# Patient Record
Sex: Female | Born: 1935 | Race: White | Hispanic: No | Smoking: Never smoker
Health system: Southern US, Community
[De-identification: ages and names within clinical notes are randomized; demographics above are authoritative.]

## PROBLEM LIST (undated history)

## (undated) DIAGNOSIS — S329XXA Fracture of unspecified parts of lumbosacral spine and pelvis, initial encounter for closed fracture: Secondary | ICD-10-CM

## (undated) HISTORY — PX: CATARACT EXTRACTION: SUR2

---

## 1998-05-05 ENCOUNTER — Ambulatory Visit (HOSPITAL_COMMUNITY): Admission: RE | Admit: 1998-05-05 | Discharge: 1998-05-05 | Payer: Self-pay | Admitting: Obstetrics and Gynecology

## 1999-05-07 ENCOUNTER — Ambulatory Visit (HOSPITAL_COMMUNITY): Admission: RE | Admit: 1999-05-07 | Discharge: 1999-05-07 | Payer: Self-pay | Admitting: Obstetrics and Gynecology

## 1999-05-07 ENCOUNTER — Encounter: Payer: Self-pay | Admitting: Obstetrics and Gynecology

## 1999-06-08 ENCOUNTER — Other Ambulatory Visit: Admission: RE | Admit: 1999-06-08 | Discharge: 1999-06-08 | Payer: Self-pay | Admitting: Obstetrics and Gynecology

## 2000-06-28 ENCOUNTER — Other Ambulatory Visit: Admission: RE | Admit: 2000-06-28 | Discharge: 2000-06-28 | Payer: Self-pay | Admitting: Family Medicine

## 2000-07-11 ENCOUNTER — Ambulatory Visit (HOSPITAL_COMMUNITY): Admission: RE | Admit: 2000-07-11 | Discharge: 2000-07-11 | Payer: Self-pay | Admitting: Family Medicine

## 2000-07-11 ENCOUNTER — Encounter: Payer: Self-pay | Admitting: Family Medicine

## 2013-11-12 ENCOUNTER — Encounter (HOSPITAL_COMMUNITY): Payer: Self-pay | Admitting: Emergency Medicine

## 2013-11-12 DIAGNOSIS — M545 Low back pain, unspecified: Secondary | ICD-10-CM | POA: Insufficient documentation

## 2013-11-12 DIAGNOSIS — I10 Essential (primary) hypertension: Secondary | ICD-10-CM | POA: Insufficient documentation

## 2013-11-12 NOTE — ED Notes (Signed)
Pt st's she started having discomfort in right hip and leg  1 week ago.  St's she could walk and pain would subside.  Pt st's tonight unable to walk due to pain.  St's she walked a mile earlier today without any problem.  Pt denies any injury or fall

## 2013-11-13 ENCOUNTER — Emergency Department (HOSPITAL_COMMUNITY)
Admission: EM | Admit: 2013-11-13 | Discharge: 2013-11-13 | Disposition: A | Discharge status: Home / Self Care | Payer: Medicare Other | Attending: Emergency Medicine | Admitting: Emergency Medicine

## 2013-11-13 ENCOUNTER — Emergency Department (HOSPITAL_COMMUNITY): Payer: Medicare Other

## 2013-11-13 DIAGNOSIS — I10 Essential (primary) hypertension: Secondary | ICD-10-CM

## 2013-11-13 DIAGNOSIS — M549 Dorsalgia, unspecified: Secondary | ICD-10-CM

## 2013-11-13 LAB — URINALYSIS, ROUTINE W REFLEX MICROSCOPIC
Bilirubin Urine: NEGATIVE
Glucose, UA: NEGATIVE mg/dL
Ketones, ur: NEGATIVE mg/dL
Leukocytes, UA: NEGATIVE
Nitrite: NEGATIVE
Protein, ur: NEGATIVE mg/dL
Specific Gravity, Urine: 1.01 (ref 1.005–1.030)
Urobilinogen, UA: 0.2 mg/dL (ref 0.0–1.0)
pH: 7 (ref 5.0–8.0)

## 2013-11-13 LAB — URINE MICROSCOPIC-ADD ON

## 2013-11-13 MED ORDER — AMLODIPINE BESYLATE 10 MG PO TABS
10.0000 mg | ORAL_TABLET | Freq: Once | ORAL | Status: AC
  Administered 2013-11-13: 10 mg via ORAL
  Filled 2013-11-13: qty 1

## 2013-11-13 MED ORDER — OXYCODONE-ACETAMINOPHEN 5-325 MG PO TABS
1.0000 | ORAL_TABLET | Freq: Once | ORAL | Status: AC
  Administered 2013-11-13: 1 via ORAL
  Filled 2013-11-13: qty 1

## 2013-11-13 MED ORDER — OXYCODONE-ACETAMINOPHEN 5-325 MG PO TABS: 1.0000 | ORAL_TABLET | ORAL | Status: DC | PRN

## 2013-11-13 NOTE — ED Provider Notes (Signed)
CSN: [id number]     Arrival date & time 11/12/13  2239 History   First MD Initiated Contact with Patient 11/13/13 0056     Chief Complaint  Patient presents with  . Hip Pain     (Consider location/radiation/quality/duration/timing/severity/associated sxs/prior Treatment) HPI Comments: 80 year old female with no significant past medical history presents with a complaint of low back pain. She states that the symptoms started 3 or 4 days ago, were gradual in onset, waxes and wanes in intensity and is a crampy dull feeling in her bilateral lower back. She has some radiation into her right leg which she describes as a burning sensation that goes above the knee. She denies any numbness, denies weakness, she was able to ambulate 1 mile today. She has no fever, no chills, no history of IV drug use, no history of cancer, the pain does get better with certain positions and she states that she feels better when she walks. There has been no urinary incontinence, dysuria or any other complaints.  She has had no pain medications prior to arrival.  Patient is a 80 y.o. female presenting with hip pain. The history is provided by the patient and a relative.  Hip Pain    History reviewed. No pertinent past medical history. Past Surgical History  Procedure Laterality Date  . Cataract extraction     No family history on file. History  Substance Use Topics  . Smoking status: Never Smoker   . Smokeless tobacco: Not on file  . Alcohol Use: No   OB History   Grav Para Term Preterm Abortions TAB SAB Ect Mult Living                 Review of Systems  All other systems reviewed and are negative.      Allergies  Review of patient's allergies indicates no known allergies.  Home Medications   Current Outpatient Rx  Name  Route  Sig  Dispense  Refill  . oxyCODONE-acetaminophen (PERCOCET/ROXICET) 5-325 MG per tablet   Oral   Take 1 tablet by mouth every 4 (four) hours as needed for severe pain.  6 tablet   0    BP 189/70  Pulse 56  Temp(Src) 97.8 F (36.6 C) (Oral)  Resp 18  Wt 122 lb 5 oz (55.481 kg)  SpO2 96% Physical Exam  Nursing note and vitals reviewed. Constitutional: She appears well-developed and well-nourished. No distress.  HENT:  Head: Normocephalic and atraumatic.  Mouth/Throat: Oropharynx is clear and moist. No oropharyngeal exudate.  Eyes: Conjunctivae and EOM are normal. Pupils are equal, round, and reactive to light. Right eye exhibits no discharge. Left eye exhibits no discharge. No scleral icterus.  Neck: Normal range of motion. Neck supple. No JVD present. No thyromegaly present.  Cardiovascular: Normal rate, regular rhythm, normal heart sounds and intact distal pulses.  Exam reveals no gallop and no friction rub.   No murmur heard. Pulmonary/Chest: Effort normal and breath sounds normal. No respiratory distress. She has no wheezes. She has no rales.  Abdominal: Soft. Bowel sounds are normal. She exhibits no distension and no mass. There is no tenderness.  No pulsating mass, no tenderness, very soft  Musculoskeletal: Normal range of motion. She exhibits no edema and no tenderness.  No reproducible tenderness over the spine or the paraspinal muscles  Lymphadenopathy:    She has no cervical adenopathy.  Neurological: She is alert. Coordination normal.  Speech is clear, cranial nerves III through XII are intact, memory is  intact, strength is normal in all 4 extremities including grips, sensation is intact to light touch and pinprick in all 4 extremities. Coordination as tested by finger-nose-finger is normal, no limb ataxia. Normal gait, normal reflexes at the patellar tendons bilaterally  Skin: Skin is warm and dry. No rash noted. No erythema.  Psychiatric: She has a normal mood and affect. Her behavior is normal.    ED Course  Procedures (including critical care time) Labs Review Labs Reviewed  URINALYSIS, ROUTINE W REFLEX MICROSCOPIC - Abnormal;  Notable for the following:    Hgb urine dipstick TRACE (*)    All other components within normal limits  URINE MICROSCOPIC-ADD ON   Imaging Review Dg Lumbar Spine Complete  11/13/2013   CLINICAL DATA:  Low back, right hip and leg pain.  EXAM: LUMBAR SPINE - COMPLETE 4+ VIEW  COMPARISON:  None.  FINDINGS: No fracture is identified. The patient has marked convex right scoliosis and severe multilevel degenerative disc disease. Paraspinous structures are unremarkable.  IMPRESSION: No acute finding.  Scoliosis and severe spondylosis.   Electronically Signed   By: Drusilla Kannerhomas  Dalessio M.D.   On: 11/13/2013 01:46     MDM   Final diagnoses:  Back pain  Hypertension    The patient has a normal neurologic exam, normal abdominal exam, normal musculoskeletal exam. She has no urinary symptoms. I suspect a musculoskeletal etiology of her pain however other sources would be entertained including bony etiologies, compression fractures, metastatic disease though less likely. She is very hypertensive, will give pain medication and reevaluate, may need to start an antihypertensive. She denies history of hypertension  Patient informed of hypertension, this has improved, her pain has improved with medication, she has been informed of the need to follow up in 48 hours.  X-ray shows no signs of fractures or tumors   Meds given in ED:  Medications  oxyCODONE-acetaminophen (PERCOCET/ROXICET) 5-325 MG per tablet 1 tablet (1 tablet Oral Given 11/13/13 0125)  amLODipine (NORVASC) tablet 10 mg (10 mg Oral Given 11/13/13 0216)    New Prescriptions   OXYCODONE-ACETAMINOPHEN (PERCOCET/ROXICET) 5-325 MG PER TABLET    Take 1 tablet by mouth every 4 (four) hours as needed for severe pain.       Vida RollerBrian D Lacorey Brusca, MD 11/13/13 [phone]

## 2013-11-13 NOTE — Discharge Instructions (Signed)
Your blood pressure was elevated. You will have to have her blood pressure rechecked at your doctor's office this week at your followup visit. You may need to be started on blood pressure medication.  Back Pain:   Your back pain should be treated with medicines such as ibuprofen or aleve and this back pain should get better over the next 2 weeks.  However if you develop severe or worsening pain, low back pain with fever, numbness, weakness or inability to walk or urinate, you should return to the ER immediately.  Please follow up with your doctor this week for a recheck if still having symptoms. Low back pain is discomfort in the lower back that may be due to injuries to muscles and ligaments around the spine.  Occasionally, it may be caused by a a problem to a part of the spine called a disc.  The pain may last several days or a week;  However, most patients get completely well in 4 weeks.  Self - care:  The application of heat can help soothe the pain.  Maintaining your daily activities, including walking, is encourged, as it will help you get better faster than just staying in bed.  Medications are also useful to help with pain control.  A commonly prescribed medications includes acetaminophen.  This medication is generally safe, though you should not take more than 8 of the extra strength (500mg ) pills a day.  Non steroidal anti inflammatory medications including Ibuprofen and naproxen;  These medications help both pain and swelling and are very useful in treating back pain.  They should be taken with food, as they can cause stomach upset, and more seriously, stomach bleeding.    Muscle relaxants:  These medications can help with muscle tightness that is a cause of lower back pain.  Most of these medications can cause drowsiness, and it is not safe to drive or use dangerous machinery while taking them.  You will need to follow up with  Your primary healthcare provider in 1-2 weeks for  reassessment.  Be aware that if you develop new symptoms, such as a fever, leg weakness, difficulty with or loss of control of your urine or bowels, abdominal pain, or more severe pain, you will need to seek medical attention and  / or return to the Emergency department.  If you do not have a doctor see the list below.  Please call your doctor for a followup appointment within 24-48 hours. When you talk to your doctor please let them know that you were seen in the emergency department and have them acquire all of your records so that they can discuss the findings with you and formulate a treatment plan to fully care for your new and ongoing problems.

## 2013-11-13 NOTE — ED Notes (Signed)
Pt taken to xray 

## 2016-11-27 ENCOUNTER — Ambulatory Visit (INDEPENDENT_AMBULATORY_CARE_PROVIDER_SITE_OTHER): Payer: Medicare Other

## 2016-11-27 ENCOUNTER — Ambulatory Visit (HOSPITAL_COMMUNITY)
Admission: EM | Admit: 2016-11-27 | Discharge: 2016-11-27 | Disposition: A | Discharge status: Home / Self Care | Payer: Medicare Other | Attending: Family Medicine | Admitting: Family Medicine

## 2016-11-27 ENCOUNTER — Encounter (HOSPITAL_COMMUNITY): Payer: Self-pay | Admitting: Emergency Medicine

## 2016-11-27 DIAGNOSIS — S32592A Other specified fracture of left pubis, initial encounter for closed fracture: Secondary | ICD-10-CM

## 2016-11-27 DIAGNOSIS — M25552 Pain in left hip: Secondary | ICD-10-CM | POA: Diagnosis not present

## 2016-11-27 MED ORDER — HYDROCODONE-ACETAMINOPHEN 5-325 MG PO TABS: 1.0000 | ORAL_TABLET | Freq: Four times a day (QID) | ORAL | 0 refills | Status: DC | PRN

## 2016-11-27 NOTE — ED Provider Notes (Signed)
[facility]    CSN: [id number] Arrival date & time: 11/27/16  1203     History   Chief Complaint Chief Complaint  Patient presents with  . Leg Pain    HPI Anna Mullins is a 83 y.o. female.   This 83 year old woman who comes to the urgent care center complaining of left proximal thigh pain for one month. She states that she's had no trauma but was given an anti-inflammatory about 2 weeks ago and has noticed the pain is getting worse. She no longer compare weight and she complains about pain anytime she moves the hip. She is using a bedside commode at home. She hasn't left her house except to go to the doctor in the last couple weeks.      History reviewed. No pertinent past medical history.  There are no active problems to display for this patient.   Past Surgical History:  Procedure Laterality Date  . CATARACT EXTRACTION      OB History    No data available       Home Medications    Prior to Admission medications   Medication Sig Start Date End Date Taking? Authorizing Provider  ibuprofen (ADVIL,MOTRIN) 200 MG tablet Take 200 mg by mouth every 6 (six) hours as needed.   Yes Historical Provider, MD  HYDROcodone-acetaminophen (NORCO) 5-325 MG tablet Take 1 tablet by mouth every 6 (six) hours as needed for moderate pain. 11/27/16   Elvina Sidle, MD    Family History History reviewed. No pertinent family history.  Social History Social History  Substance Use Topics  . Smoking status: Never Smoker  . Smokeless tobacco: Never Used  . Alcohol use No     Allergies   Patient has no known allergies.   Review of Systems Review of Systems  Constitutional: Negative.   Musculoskeletal: Positive for gait problem.     Physical Exam Triage Vital Signs ED Triage Vitals [11/27/16 1259]  Enc Vitals Group     BP (!) 171/70     Pulse Rate 76     Resp 16     Temp 98.1 F (36.7 C)     Temp Source Oral     SpO2 96 %     Weight      Height      Head  Circumference      Peak Flow      Pain Score 10     Pain Loc      Pain Edu?      Excl. in GC?    No data found.   Updated Vital Signs BP (!) 171/70 (BP Location: Left Arm)   Pulse 76   Temp 98.1 F (36.7 C) (Oral)   Resp 16   SpO2 96%    Physical Exam  Constitutional: She is oriented to person, place, and time. She appears well-developed and well-nourished.  HENT:  Right Ear: External ear normal.  Left Ear: External ear normal.  Eyes: Conjunctivae and EOM are normal. Pupils are equal, round, and reactive to light.  Neck: Normal range of motion. Neck supple.  Pulmonary/Chest: Effort normal.  Musculoskeletal:  Pain with hip flexion on the left. Patient can internally and externally rotate her leg without problem. There's been no loss of muscle mass and she has no localized tenderness.  Neurological: She is alert and oriented to person, place, and time.  Skin: Skin is warm and dry.  Nursing note and vitals reviewed.    UC Treatments / Results  Labs (all labs ordered are listed, but only abnormal results are displayed) Labs Reviewed - No data to display  EKG  EKG Interpretation None       Radiology Dg Hip Unilat With Pelvis 2-3 Views Left  Result Date: 11/27/2016 CLINICAL DATA:  LEFT hip pain for 6 weeks, worsening, unable to bear weight for the past 3 days without extreme pain, unable to walk without assistance EXAM: DG HIP (WITH OR WITHOUT PELVIS) 2-3V LEFT COMPARISON:  None FINDINGS: Diffuse osseous demineralization. Minimal narrowing of the hip joints bilaterally. SI joints preserved. Subacute mildly displaced fracture at the LEFT superior pubic ramus identified. Probable nondisplaced fracture inferior LEFT pubic ramus. No femoral fracture or dislocation. IMPRESSION: Osseous demineralization. Subacute mildly displaced fracture at the LEFT superior pubic ramus. Probable nondisplaced fracture of the LEFT inferior pubic ramus. Electronically Signed   By: Ulyses Southward M.D.    On: 11/27/2016 13:41    Procedures Procedures (including critical care time)  Medications Ordered in UC Medications - No data to display   Initial Impression / Assessment and Plan / UC Course  I have reviewed the triage vital signs and the nursing notes.  Pertinent labs & imaging results that were available during my care of the patient were reviewed by me and considered in my medical decision making (see chart for details).     Final Clinical Impressions(s) / UC Diagnoses   Final diagnoses:  Closed fracture of ramus of left pubis, initial encounter (HCC)    New Prescriptions New Prescriptions   HYDROCODONE-ACETAMINOPHEN (NORCO) 5-325 MG TABLET    Take 1 tablet by mouth every 6 (six) hours as needed for moderate pain.     Elvina Sidle, MD 11/27/16 [phone]

## 2016-11-27 NOTE — ED Triage Notes (Signed)
The patient presented to the [facility] with her daughter with a complaint of left leg pain x 2 months. The patient denied any known injury. The patient reported that she was immobile today. The patient reported that she was evaluated by her PCP for a DVT which was negative.

## 2016-11-27 NOTE — Discharge Instructions (Signed)
Continue to take calcium supplements that contain vitamin D.  Try to stay off your feet except when absolutely necessary.  Take prunes or fruit to prevent constipation

## 2016-12-22 ENCOUNTER — Ambulatory Visit
Admission: RE | Admit: 2016-12-22 | Discharge: 2016-12-22 | Disposition: A | Discharge status: Home / Self Care | Payer: Medicare Other | Source: Ambulatory Visit | Attending: Family Medicine | Admitting: Family Medicine

## 2016-12-22 ENCOUNTER — Other Ambulatory Visit: Payer: Self-pay | Admitting: Family Medicine

## 2016-12-22 DIAGNOSIS — S32512D Fracture of superior rim of left pubis, subsequent encounter for fracture with routine healing: Secondary | ICD-10-CM

## 2017-07-10 ENCOUNTER — Ambulatory Visit (INDEPENDENT_AMBULATORY_CARE_PROVIDER_SITE_OTHER): Payer: Medicare Other

## 2017-07-10 ENCOUNTER — Other Ambulatory Visit: Payer: Self-pay

## 2017-07-10 ENCOUNTER — Ambulatory Visit (HOSPITAL_COMMUNITY)
Admission: EM | Admit: 2017-07-10 | Discharge: 2017-07-10 | Disposition: A | Discharge status: Home / Self Care | Payer: Medicare Other | Attending: Emergency Medicine | Admitting: Emergency Medicine

## 2017-07-10 ENCOUNTER — Encounter (HOSPITAL_COMMUNITY): Payer: Self-pay | Admitting: Emergency Medicine

## 2017-07-10 DIAGNOSIS — H1033 Unspecified acute conjunctivitis, bilateral: Secondary | ICD-10-CM | POA: Diagnosis not present

## 2017-07-10 DIAGNOSIS — J069 Acute upper respiratory infection, unspecified: Secondary | ICD-10-CM | POA: Diagnosis not present

## 2017-07-10 DIAGNOSIS — R05 Cough: Secondary | ICD-10-CM | POA: Diagnosis not present

## 2017-07-10 HISTORY — DX: Fracture of unspecified parts of lumbosacral spine and pelvis, initial encounter for closed fracture: S32.9XXA

## 2017-07-10 MED ORDER — CIPROFLOXACIN HCL 0.3 % OP SOLN: OPHTHALMIC | 0 refills | Status: DC

## 2017-07-10 MED ORDER — AEROCHAMBER PLUS MISC: 2 refills | Status: DC

## 2017-07-10 MED ORDER — FLUORESCEIN SODIUM 0.6 MG OP STRP
ORAL_STRIP | OPHTHALMIC | Status: AC
  Filled 2017-07-10: qty 2

## 2017-07-10 MED ORDER — ALBUTEROL SULFATE HFA 108 (90 BASE) MCG/ACT IN AERS: 1.0000 | INHALATION_SPRAY | Freq: Four times a day (QID) | RESPIRATORY_TRACT | 0 refills | Status: DC | PRN

## 2017-07-10 MED ORDER — FLUTICASONE PROPIONATE 50 MCG/ACT NA SUSP: 2.0000 | Freq: Every day | NASAL | 0 refills | Status: DC

## 2017-07-10 MED ORDER — TETRACAINE HCL 0.5 % OP SOLN
OPHTHALMIC | Status: AC
Start: 2017-07-10 — End: 2017-07-10
  Filled 2017-07-10: qty 4

## 2017-07-10 NOTE — ED Provider Notes (Signed)
HPI  SUBJECTIVE:  Anna DonovanRuth Mullins is a 83 y.o. female who presents with itchy, watery, red eyes for the past 4 days.  She reports crusting, drainage from her eyes bilaterally, nasal congestion, rhinorrhea and a cough productive of dark greenish mucus.  Questionable postnasal drip.  No fevers, wheezing, chest pain, shortness of breath.  No sneezing.  No visual changes, headaches, photophobia.  She reports decreased appetite but no vomiting.  She tried a lubricating eyedrop, DayQuil and cough drops without improvement in her symptoms.  No aggravating factors.  No antibiotics in the past month.  She did take an antipyretic in the past 6-8 hours.  She has a past medical history of seasonal allergies.  No history of diabetes, hypertension, asthma, emphysema, COPD, smoking, pneumonia, CHF.  ZOX:WRUEAVPCP:Harris, Chrissie NoaWilliam, MD   Past Medical History:  Diagnosis Date  . Pelvic fracture [facility](HCC)     Past Surgical History:  Procedure Laterality Date  . CATARACT EXTRACTION      No family history on file.  Social History   Tobacco Use  . Smoking status: Never Smoker  . Smokeless tobacco: Never Used  Substance Use Topics  . Alcohol use: No  . Drug use: No    No current facility-administered medications for this encounter.   Current Outpatient Medications:  .  Pseudoephedrine-APAP-DM (DAYQUIL PO), Take by mouth., Disp: , Rfl:  .  albuterol (PROVENTIL HFA;VENTOLIN HFA) 108 (90 Base) MCG/ACT inhaler, Inhale 1-2 puffs every 6 (six) hours as needed into the lungs for wheezing or shortness of breath., Disp: 1 Inhaler, Rfl: 0 .  ciprofloxacin (CILOXAN) 0.3 % ophthalmic solution, 1-2 drops in affected eye 4 times/day x 5 days, Disp: 5 mL, Rfl: 0 .  fluticasone (FLONASE) 50 MCG/ACT nasal spray, Place 2 sprays daily into both nostrils., Disp: 16 g, Rfl: 0 .  ibuprofen (ADVIL,MOTRIN) 200 MG tablet, Take 200 mg by mouth every 6 (six) hours as needed., Disp: , Rfl:  .  Spacer/Aero-Holding Chambers (AEROCHAMBER PLUS) inhaler,  Use as instructed, Disp: 1 each, Rfl: 2  No Known Allergies   ROS  As noted in HPI.   Physical Exam  BP (!) 159/79 (BP Location: Left Arm)   Pulse 87   Temp 99.4 F (37.4 C) (Oral)   Resp 20   SpO2 95%   @LASTSAO2 (3)@  Constitutional: Well developed, well nourished, no acute distress Eyes:  EOMI, bilateral conjunctival injection.  PERRLA.  Positive purulent drainage coming from the left eye.  No direct or consensual photophobia.  No foreign body seen on lid eversion bilaterally.  No abrasion seen on flourescin exam bilaterally.  No periorbital erythema, edema. HENT: Normocephalic, atraumatic,mucus membranes moist Respiratory: Normal inspiratory effort.  No rales, rhonchi.  No wheezing. Cardiovascular: Normal rate regular rhythm no murmurs rubs or gallops GI: nondistended skin: No rash, skin intact Musculoskeletal: no deformities Neurologic: Alert & oriented x 3, no focal neuro deficits Psychiatric: Speech and behavior appropriate   ED Course   Medications - No data to display  Orders Placed This Encounter  Procedures  . DG Chest 2 View    Standing Status:   Standing    Number of Occurrences:   1    Order Specific Question:   Reason for Exam (SYMPTOM  OR DIAGNOSIS REQUIRED)    Answer:   cough r/o PNA    No results found for this or any previous visit (from the past 24 hour(s)). Dg Chest 2 View  Result Date: 07/10/2017 CLINICAL DATA:  Cough and congestion  EXAM: CHEST  2 VIEW COMPARISON:  None. FINDINGS: Hyperinflation. No focal pulmonary infiltrate or effusion. Normal heart size. Aortic atherosclerosis. No pneumothorax. Old right-sided rib fractures. IMPRESSION: Hyperinflation.  No focal pulmonary infiltrate. Electronically Signed   By: Jasmine PangKim  Fujinaga M.D.   On: 07/10/2017 19:34    ED Clinical Impression  Upper respiratory tract infection, unspecified type  Acute conjunctivitis of both eyes, unspecified acute conjunctivitis type   ED  Assessment/Plan   Reviewed imaging independently.  No focal infiltrate or effusion.  See radiology report for full details.  Presentation consistent with URI, possibly an early lower respiratory infection.  She does not have a pneumonia on x-ray.  She has had no fevers.  Afebrile here.  Acceptable vital signs.  Withholding antibiotics today.  Plan to send her home with albuterol inhaler with a spacer, Flonase.  She will continue Mucinex.  Also antibiotic eyedrops for her eyes given the extensive purulent drainage.  She will follow-up with her primary care physician in 3 days to make sure that she is getting better, she will go to the ER for any worsening of her symptoms.  Discussed imaging, MDM, plan and followup with patient. Discussed sn/sx that should prompt return to the ED. patient agrees with plan.   Meds ordered this encounter  Medications  . Pseudoephedrine-APAP-DM (DAYQUIL PO)    Sig: Take by mouth.  . fluticasone (FLONASE) 50 MCG/ACT nasal spray    Sig: Place 2 sprays daily into both nostrils.    Dispense:  16 g    Refill:  0  . albuterol (PROVENTIL HFA;VENTOLIN HFA) 108 (90 Base) MCG/ACT inhaler    Sig: Inhale 1-2 puffs every 6 (six) hours as needed into the lungs for wheezing or shortness of breath.    Dispense:  1 Inhaler    Refill:  0  . Spacer/Aero-Holding Chambers (AEROCHAMBER PLUS) inhaler    Sig: Use as instructed    Dispense:  1 each    Refill:  2  . ciprofloxacin (CILOXAN) 0.3 % ophthalmic solution    Sig: 1-2 drops in affected eye 4 times/day x 5 days    Dispense:  5 mL    Refill:  0    *This clinic note was created using Scientist, clinical (histocompatibility and immunogenetics)Dragon dictation software. Therefore, there may be occasional mistakes despite careful proofreading.   ?   Domenick GongMortenson, Haely Leyland, MD 07/10/17 2130

## 2017-07-10 NOTE — ED Triage Notes (Signed)
Cough, eyes red and watery, has had head congestion, now chest congestion and phlegm is green.  No known fever.  No appetite

## 2017-07-10 NOTE — Discharge Instructions (Signed)
1-2 puffs from your albuterol inhaler every 4-6 hours as needed for coughing.   start the Mucinex multisymptom medication, if there are to many side effects, then try plain Mucinex.  Cool compresses to the eyes, continue using the lubricating eyedrops.  Use the antibiotic eyedrops as prescribed unless directed differently by your primary care provider.  Follow-up with him in several days.  Go immediately to the ER if you get worse or for the signs and symptoms we discussed.

## 2017-07-11 ENCOUNTER — Telehealth (HOSPITAL_COMMUNITY): Payer: Self-pay | Admitting: Emergency Medicine

## 2017-07-11 MED ORDER — POLYMYXIN B-TRIMETHOPRIM 10000-0.1 UNIT/ML-% OP SOLN: 1.0000 [drp] | OPHTHALMIC | 0 refills | Status: AC

## 2017-07-11 NOTE — Telephone Encounter (Signed)
Pt family called and stated that eye drops were on back order and needed new RX; [facility]

## 2017-11-23 IMAGING — DX DG CHEST 2V
2 series · 2 of 2 positions shown · non-contrast
Comparison: None.

CLINICAL DATA: Cough and congestion

EXAM:
CHEST  2 VIEW

[chest pa]
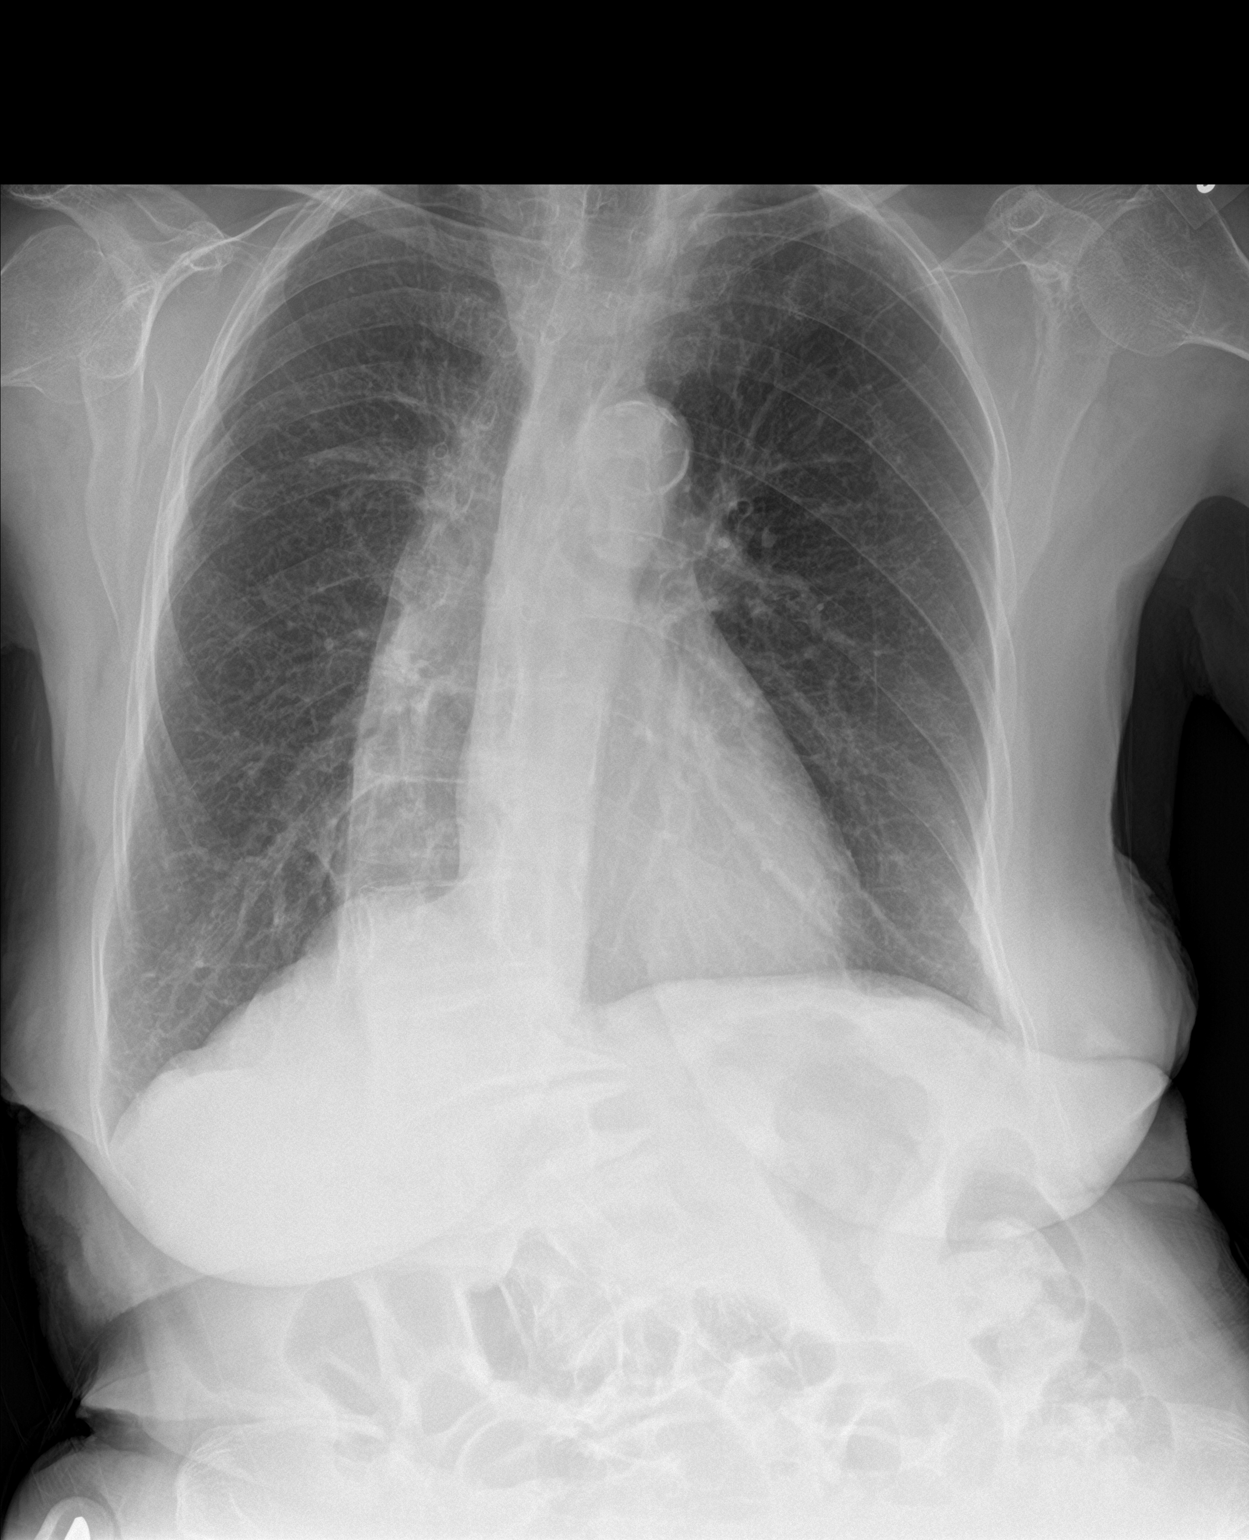

[chest lat]
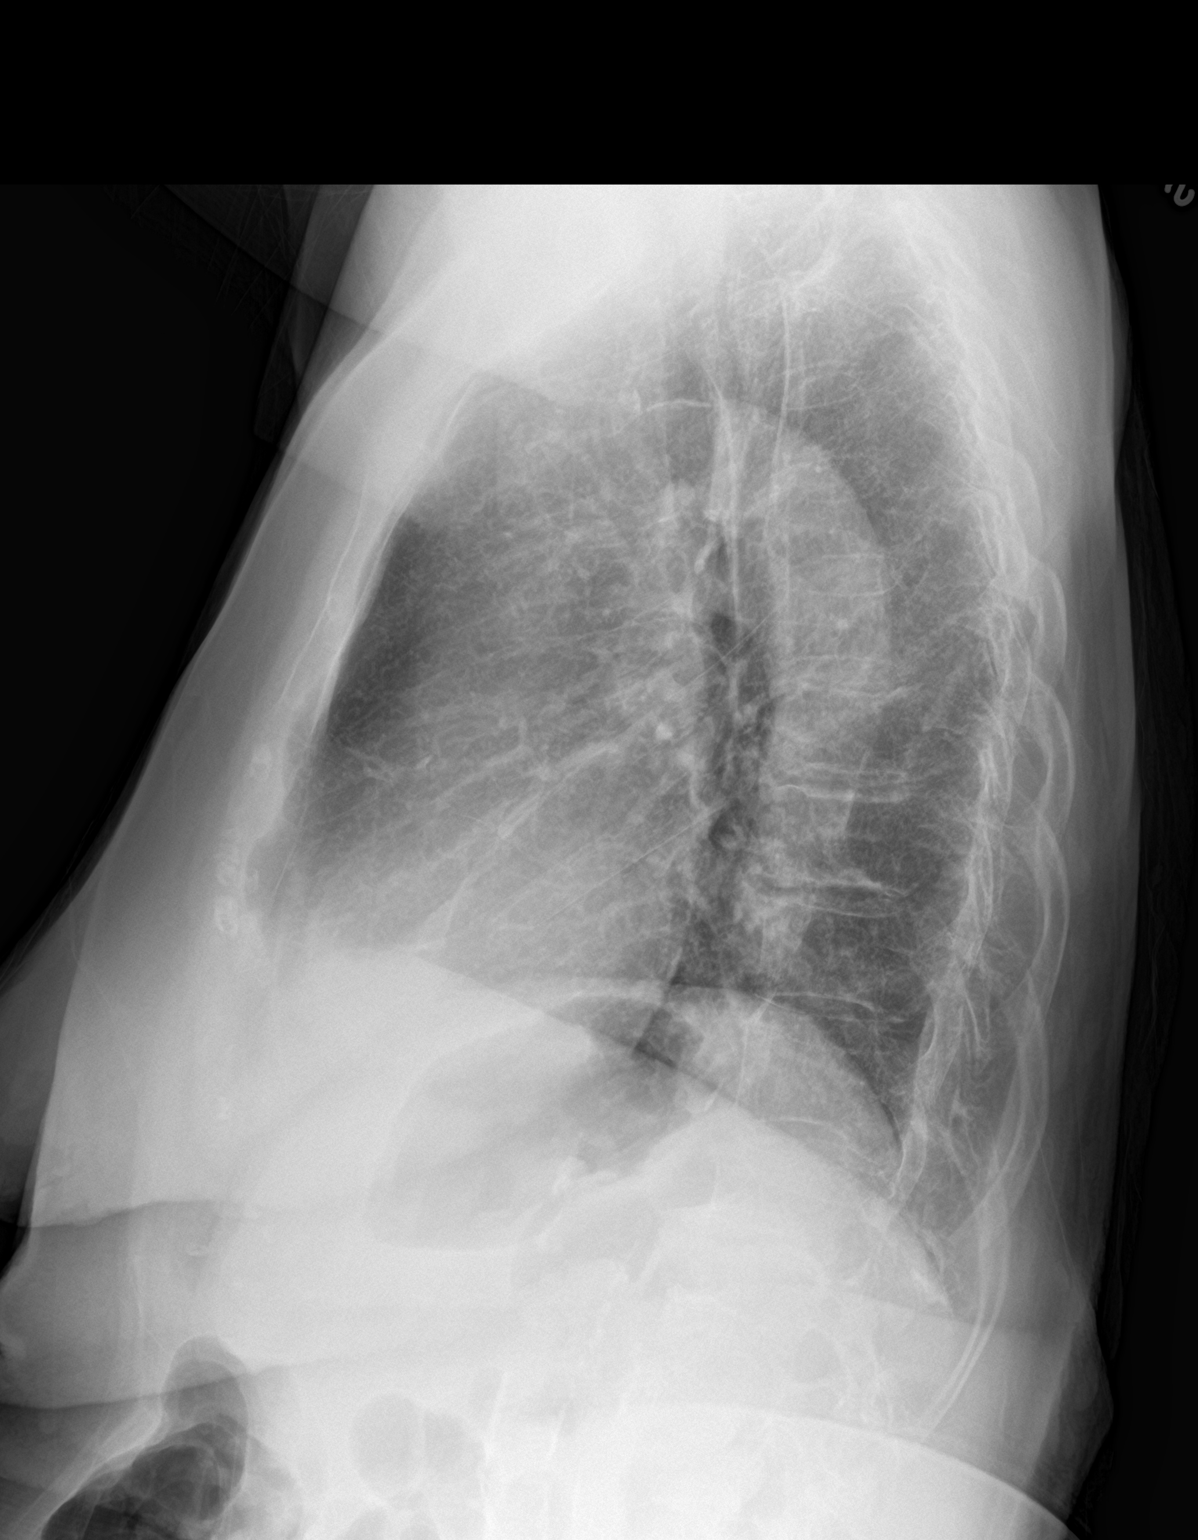

[2 of 2 positions shown; findings below may reference images not displayed]

FINDINGS: Hyperinflation. No focal pulmonary infiltrate or effusion. Normal
heart size. Aortic atherosclerosis. No pneumothorax. Old right-sided
rib fractures.
IMPRESSION: Hyperinflation.  No focal pulmonary infiltrate.

## 2019-09-20 ENCOUNTER — Ambulatory Visit: Payer: Medicare Other | Attending: Internal Medicine

## 2019-09-20 DIAGNOSIS — Z23 Encounter for immunization: Secondary | ICD-10-CM | POA: Insufficient documentation

## 2019-09-20 NOTE — Progress Notes (Signed)
   Covid-19 Vaccination Clinic  Name:  Anna Mullins    MRN: [id number] DOB: [birth year]  09/20/2019  Anna Mullins was observed post Covid-19 immunization for 15 minutes without incidence. She was provided with Vaccine Information Sheet and instruction to access the V-Safe system.   Anna Mullins was instructed to call 911 with any severe reactions post vaccine: Marland Kitchen Difficulty breathing  . Swelling of your face and throat  . A fast heartbeat  . A bad rash all over your body  . Dizziness and weakness    Immunizations Administered    Name Date Dose VIS Date Route   Pfizer COVID-19 Vaccine 09/20/2019 11:09 AM 0.3 mL 08/09/2019 Intramuscular   Manufacturer: [employer], [employer]   Lot: [id number]   NDC: [address]-5

## 2019-10-11 ENCOUNTER — Ambulatory Visit: Payer: Medicare Other | Attending: Internal Medicine

## 2019-10-11 DIAGNOSIS — Z23 Encounter for immunization: Secondary | ICD-10-CM | POA: Insufficient documentation

## 2019-10-11 NOTE — Progress Notes (Signed)
   Covid-19 Vaccination Clinic  Name:  Anna Mullins    MRN: [id number] DOB: [birth year]  10/11/2019  Anna Mullins was observed post Covid-19 immunization for 15 minutes without incidence. She was provided with Vaccine Information Sheet and instruction to access the V-Safe system.   Anna Mullins was instructed to call 911 with any severe reactions post vaccine: Marland Kitchen Difficulty breathing  . Swelling of your face and throat  . A fast heartbeat  . A bad rash all over your body  . Dizziness and weakness    Immunizations Administered    Name Date Dose VIS Date Route   Pfizer COVID-19 Vaccine 10/11/2019 11:08 AM 0.3 mL 08/09/2019 Intramuscular   Manufacturer: [employer], [employer]   Lot: [id number]   NDC: [address]-2

## 2021-01-04 DIAGNOSIS — Z0000 Encounter for general adult medical examination without abnormal findings: Secondary | ICD-10-CM | POA: Diagnosis not present

## 2021-01-04 DIAGNOSIS — E7800 Pure hypercholesterolemia, unspecified: Secondary | ICD-10-CM | POA: Diagnosis not present

## 2021-07-01 DIAGNOSIS — Z23 Encounter for immunization: Secondary | ICD-10-CM | POA: Diagnosis not present

## 2021-07-01 DIAGNOSIS — R7303 Prediabetes: Secondary | ICD-10-CM | POA: Diagnosis not present

## 2021-12-29 DIAGNOSIS — R7303 Prediabetes: Secondary | ICD-10-CM | POA: Diagnosis not present

## 2022-07-25 DIAGNOSIS — Z0000 Encounter for general adult medical examination without abnormal findings: Secondary | ICD-10-CM | POA: Diagnosis not present

## 2022-07-25 DIAGNOSIS — E7800 Pure hypercholesterolemia, unspecified: Secondary | ICD-10-CM | POA: Diagnosis not present

## 2022-07-25 DIAGNOSIS — R7303 Prediabetes: Secondary | ICD-10-CM | POA: Diagnosis not present

## 2022-07-25 DIAGNOSIS — Z23 Encounter for immunization: Secondary | ICD-10-CM | POA: Diagnosis not present

## 2023-05-25 ENCOUNTER — Emergency Department (HOSPITAL_COMMUNITY): Payer: Medicare Other

## 2023-05-25 ENCOUNTER — Observation Stay (HOSPITAL_COMMUNITY)
Admission: EM | Admit: 2023-05-25 | Discharge: 2023-05-27 | Disposition: A | Discharge status: Home Health | Payer: Medicare Other | Attending: Internal Medicine | Admitting: Internal Medicine

## 2023-05-25 ENCOUNTER — Other Ambulatory Visit: Payer: Self-pay

## 2023-05-25 DIAGNOSIS — I639 Cerebral infarction, unspecified: Secondary | ICD-10-CM | POA: Diagnosis not present

## 2023-05-25 DIAGNOSIS — I6523 Occlusion and stenosis of bilateral carotid arteries: Secondary | ICD-10-CM | POA: Diagnosis not present

## 2023-05-25 DIAGNOSIS — Z79899 Other long term (current) drug therapy: Secondary | ICD-10-CM | POA: Diagnosis not present

## 2023-05-25 DIAGNOSIS — I6782 Cerebral ischemia: Secondary | ICD-10-CM | POA: Diagnosis not present

## 2023-05-25 DIAGNOSIS — R2681 Unsteadiness on feet: Secondary | ICD-10-CM | POA: Insufficient documentation

## 2023-05-25 DIAGNOSIS — M6281 Muscle weakness (generalized): Secondary | ICD-10-CM | POA: Insufficient documentation

## 2023-05-25 DIAGNOSIS — M47812 Spondylosis without myelopathy or radiculopathy, cervical region: Secondary | ICD-10-CM | POA: Diagnosis not present

## 2023-05-25 DIAGNOSIS — I6529 Occlusion and stenosis of unspecified carotid artery: Secondary | ICD-10-CM | POA: Diagnosis not present

## 2023-05-25 DIAGNOSIS — G9341 Metabolic encephalopathy: Secondary | ICD-10-CM | POA: Insufficient documentation

## 2023-05-25 DIAGNOSIS — W19XXXA Unspecified fall, initial encounter: Secondary | ICD-10-CM | POA: Diagnosis not present

## 2023-05-25 DIAGNOSIS — G9340 Encephalopathy, unspecified: Secondary | ICD-10-CM | POA: Diagnosis present

## 2023-05-25 DIAGNOSIS — Z1152 Encounter for screening for COVID-19: Secondary | ICD-10-CM | POA: Insufficient documentation

## 2023-05-25 DIAGNOSIS — R9431 Abnormal electrocardiogram [ECG] [EKG]: Secondary | ICD-10-CM | POA: Diagnosis not present

## 2023-05-25 DIAGNOSIS — I10 Essential (primary) hypertension: Secondary | ICD-10-CM | POA: Insufficient documentation

## 2023-05-25 DIAGNOSIS — R000 Tachycardia, unspecified: Secondary | ICD-10-CM | POA: Diagnosis not present

## 2023-05-25 DIAGNOSIS — W1830XA Fall on same level, unspecified, initial encounter: Secondary | ICD-10-CM | POA: Diagnosis present

## 2023-05-25 DIAGNOSIS — N390 Urinary tract infection, site not specified: Principal | ICD-10-CM | POA: Insufficient documentation

## 2023-05-25 DIAGNOSIS — F0390 Unspecified dementia without behavioral disturbance: Secondary | ICD-10-CM | POA: Diagnosis not present

## 2023-05-25 DIAGNOSIS — R4182 Altered mental status, unspecified: Secondary | ICD-10-CM

## 2023-05-25 DIAGNOSIS — E119 Type 2 diabetes mellitus without complications: Secondary | ICD-10-CM | POA: Insufficient documentation

## 2023-05-25 DIAGNOSIS — I700 Atherosclerosis of aorta: Secondary | ICD-10-CM | POA: Diagnosis not present

## 2023-05-25 DIAGNOSIS — S199XXA Unspecified injury of neck, initial encounter: Secondary | ICD-10-CM | POA: Diagnosis not present

## 2023-05-25 DIAGNOSIS — I499 Cardiac arrhythmia, unspecified: Secondary | ICD-10-CM | POA: Diagnosis not present

## 2023-05-25 DIAGNOSIS — S0990XA Unspecified injury of head, initial encounter: Secondary | ICD-10-CM | POA: Diagnosis not present

## 2023-05-25 LAB — CBC WITH DIFFERENTIAL/PLATELET
Abs Immature Granulocytes: 0.04 10*3/uL (ref 0.00–0.07)
Basophils Absolute: 0.1 10*3/uL (ref 0.0–0.1)
Basophils Relative: 1 %
Eosinophils Absolute: 0.2 10*3/uL (ref 0.0–0.5)
Eosinophils Relative: 2 %
HCT: 43.7 % (ref 36.0–46.0)
Hemoglobin: 13.8 g/dL (ref 12.0–15.0)
Immature Granulocytes: 1 %
Lymphocytes Relative: 16 %
Lymphs Abs: 1.4 10*3/uL (ref 0.7–4.0)
MCH: 30.1 pg (ref 26.0–34.0)
MCHC: 31.6 g/dL (ref 30.0–36.0)
MCV: 95.2 fL (ref 80.0–100.0)
Monocytes Absolute: 0.3 10*3/uL (ref 0.1–1.0)
Monocytes Relative: 4 %
Neutro Abs: 6.6 10*3/uL (ref 1.7–7.7)
Neutrophils Relative %: 76 %
Platelets: 233 10*3/uL (ref 150–400)
RBC: 4.59 MIL/uL (ref 3.87–5.11)
RDW: 12.1 % (ref 11.5–15.5)
WBC: 8.6 10*3/uL (ref 4.0–10.5)
nRBC: 0 % (ref 0.0–0.2)

## 2023-05-25 LAB — I-STAT CHEM 8, ED
BUN: 17 mg/dL (ref 8–23)
Calcium, Ion: 1.2 mmol/L (ref 1.15–1.40)
Chloride: 107 mmol/L (ref 98–111)
Creatinine, Ser: 1 mg/dL (ref 0.44–1.00)
Glucose, Bld: 154 mg/dL — ABNORMAL HIGH (ref 70–99)
HCT: 41 % (ref 36.0–46.0)
Hemoglobin: 13.9 g/dL (ref 12.0–15.0)
Potassium: 4 mmol/L (ref 3.5–5.1)
Sodium: 140 mmol/L (ref 135–145)
TCO2: 21 mmol/L — ABNORMAL LOW (ref 22–32)

## 2023-05-25 LAB — URINALYSIS, ROUTINE W REFLEX MICROSCOPIC
Bilirubin Urine: NEGATIVE
Glucose, UA: NEGATIVE mg/dL
Hgb urine dipstick: NEGATIVE
Ketones, ur: 5 mg/dL — AB
Leukocytes,Ua: NEGATIVE
Nitrite: NEGATIVE
Protein, ur: 30 mg/dL — AB
Specific Gravity, Urine: 1.023 (ref 1.005–1.030)
pH: 5 (ref 5.0–8.0)

## 2023-05-25 LAB — COMPREHENSIVE METABOLIC PANEL
ALT: 9 U/L (ref 0–44)
AST: 19 U/L (ref 15–41)
Albumin: 3 g/dL — ABNORMAL LOW (ref 3.5–5.0)
Alkaline Phosphatase: 83 U/L (ref 38–126)
Anion gap: 12 (ref 5–15)
BUN: 15 mg/dL (ref 8–23)
CO2: 16 mmol/L — ABNORMAL LOW (ref 22–32)
Calcium: 8.8 mg/dL — ABNORMAL LOW (ref 8.9–10.3)
Chloride: 109 mmol/L (ref 98–111)
Creatinine, Ser: 1.07 mg/dL — ABNORMAL HIGH (ref 0.44–1.00)
GFR, Estimated: 47 mL/min — ABNORMAL LOW (ref >60)
Glucose, Bld: 158 mg/dL — ABNORMAL HIGH (ref 70–99)
Potassium: 3.9 mmol/L (ref 3.5–5.1)
Sodium: 137 mmol/L (ref 135–145)
Total Bilirubin: 1 mg/dL (ref 0.3–1.2)
Total Protein: 6.1 g/dL — ABNORMAL LOW (ref 6.5–8.1)

## 2023-05-25 LAB — I-STAT CG4 LACTIC ACID, ED: Lactic Acid, Venous: 1.8 mmol/L (ref 0.5–1.9)

## 2023-05-25 LAB — CBG MONITORING, ED: Glucose-Capillary: 155 mg/dL — ABNORMAL HIGH (ref 70–99)

## 2023-05-25 MED ORDER — LACTATED RINGERS IV SOLN: INTRAVENOUS | Status: DC

## 2023-05-25 NOTE — ED Triage Notes (Signed)
Patient BIB [facility] from home for patient found on floor of laundry room, more altered than usual and initially alert and talking, now minimally responsive to pain. HR 50s, 172/88

## 2023-05-26 ENCOUNTER — Observation Stay (HOSPITAL_COMMUNITY): Payer: Medicare Other

## 2023-05-26 ENCOUNTER — Emergency Department (HOSPITAL_COMMUNITY): Payer: Medicare Other

## 2023-05-26 DIAGNOSIS — I700 Atherosclerosis of aorta: Secondary | ICD-10-CM | POA: Diagnosis not present

## 2023-05-26 DIAGNOSIS — R4182 Altered mental status, unspecified: Secondary | ICD-10-CM

## 2023-05-26 DIAGNOSIS — W1830XA Fall on same level, unspecified, initial encounter: Secondary | ICD-10-CM | POA: Diagnosis present

## 2023-05-26 DIAGNOSIS — N3001 Acute cystitis with hematuria: Secondary | ICD-10-CM

## 2023-05-26 DIAGNOSIS — R569 Unspecified convulsions: Secondary | ICD-10-CM

## 2023-05-26 DIAGNOSIS — R9089 Other abnormal findings on diagnostic imaging of central nervous system: Secondary | ICD-10-CM | POA: Diagnosis not present

## 2023-05-26 DIAGNOSIS — I6782 Cerebral ischemia: Secondary | ICD-10-CM | POA: Diagnosis not present

## 2023-05-26 DIAGNOSIS — G9340 Encephalopathy, unspecified: Secondary | ICD-10-CM | POA: Diagnosis present

## 2023-05-26 DIAGNOSIS — N390 Urinary tract infection, site not specified: Secondary | ICD-10-CM | POA: Diagnosis present

## 2023-05-26 LAB — I-STAT VENOUS BLOOD GAS, ED
Acid-base deficit: 2 mmol/L (ref 0.0–2.0)
Bicarbonate: 22.8 mmol/L (ref 20.0–28.0)
Calcium, Ion: 1.26 mmol/L (ref 1.15–1.40)
HCT: 41 % (ref 36.0–46.0)
Hemoglobin: 13.9 g/dL (ref 12.0–15.0)
O2 Saturation: 95 %
Potassium: 4.3 mmol/L (ref 3.5–5.1)
Sodium: 141 mmol/L (ref 135–145)
TCO2: 24 mmol/L (ref 22–32)
pCO2, Ven: 37.6 mm[Hg] — ABNORMAL LOW (ref 44–60)
pH, Ven: 7.391 (ref 7.25–7.43)
pO2, Ven: 76 mm[Hg] — ABNORMAL HIGH (ref 32–45)

## 2023-05-26 LAB — RESP PANEL BY RT-PCR (RSV, FLU A&B, COVID)  RVPGX2
Influenza A by PCR: NEGATIVE
Influenza B by PCR: NEGATIVE
Resp Syncytial Virus by PCR: NEGATIVE
SARS Coronavirus 2 by RT PCR: NEGATIVE

## 2023-05-26 LAB — TSH: TSH: 1.624 u[IU]/mL (ref 0.350–4.500)

## 2023-05-26 LAB — AMMONIA: Ammonia: <10 umol/L (ref 9–35)

## 2023-05-26 MED ORDER — ACETAMINOPHEN 500 MG PO TABS: 1000.0000 mg | ORAL_TABLET | Freq: Four times a day (QID) | ORAL | Status: DC | PRN

## 2023-05-26 MED ORDER — SODIUM CHLORIDE 0.9% FLUSH
3.0000 mL | Freq: Two times a day (BID) | INTRAVENOUS | Status: DC
  Administered 2023-05-26 – 2023-05-27 (×4): 3 mL via INTRAVENOUS

## 2023-05-26 MED ORDER — GADOBUTROL 1 MMOL/ML IV SOLN
5.0000 mL | Freq: Once | INTRAVENOUS | Status: AC | PRN
  Administered 2023-05-26: 5 mL via INTRAVENOUS

## 2023-05-26 MED ORDER — HYDRALAZINE HCL 20 MG/ML IJ SOLN: 5.0000 mg | Freq: Four times a day (QID) | INTRAMUSCULAR | Status: DC | PRN

## 2023-05-26 MED ORDER — SODIUM CHLORIDE 0.9 % IV SOLN
1.0000 g | INTRAVENOUS | Status: DC
  Administered 2023-05-26: 1 g via INTRAVENOUS
  Filled 2023-05-26: qty 10

## 2023-05-26 MED ORDER — ENOXAPARIN SODIUM 40 MG/0.4ML IJ SOSY
40.0000 mg | PREFILLED_SYRINGE | INTRAMUSCULAR | Status: DC
  Administered 2023-05-26 – 2023-05-27 (×2): 40 mg via SUBCUTANEOUS
  Filled 2023-05-26 (×2): qty 0.4

## 2023-05-26 MED ORDER — SODIUM CHLORIDE 0.9 % IV SOLN
1.0000 g | Freq: Once | INTRAVENOUS | Status: AC
  Administered 2023-05-26: 1 g via INTRAVENOUS
  Filled 2023-05-26: qty 10

## 2023-05-26 MED ORDER — LACTATED RINGERS IV BOLUS
250.0000 mL | Freq: Once | INTRAVENOUS | Status: AC
  Administered 2023-05-26: 250 mL via INTRAVENOUS

## 2023-05-26 NOTE — ED Notes (Signed)
Pt had episode of bowel and bladder incontinence. Writer changed brief, performed peri care, and changed linens.

## 2023-05-26 NOTE — ED Notes (Signed)
ED TO INPATIENT HANDOFF REPORT  ED Nurse Name and Phone #: Nehemiah Settle [phone]  S Name/Age/Gender Anna Mullins 89 y.o. female Room/Bed: 003C/003C  Code Status   Code Status: Limited: Do not attempt resuscitation (DNR) -DNR-LIMITED -Do Not Intubate/DNI   Home/SNF/Other Home Patient oriented to: self Is this baseline? No   Triage Complete: Triage complete  Chief Complaint Urinary tract infection [N39.0]  Triage Note Patient BIB [facility] from home for patient found on floor of laundry room, more altered than usual and initially alert and talking, now minimally responsive to pain. HR 50s, 172/88   Allergies No Known Allergies  Level of Care/Admitting Diagnosis ED Disposition     ED Disposition  Admit   Condition  --   Comment  Hospital Area: [facility] [[id number]]  Level of Care: Telemetry Medical [104]  May place patient in observation at [facility] or [facility] if equivalent level of care is available:: Yes  Covid Evaluation: Asymptomatic - no recent exposure (last 10 days) testing not required  Diagnosis: Urinary tract infection [960454]  Admitting Physician: Dolly Rias [[id number]]  Attending Physician: Dolly Rias [[id number]]          B Medical/Surgery History Past Medical History:  Diagnosis Date   Pelvic fracture (HCC)    Past Surgical History:  Procedure Laterality Date   CATARACT EXTRACTION       A IV Location/Drains/Wounds Patient Lines/Drains/Airways Status     Active Line/Drains/Airways     Name Placement date Placement time Site Days   Peripheral IV 05/25/23 20 G Left Antecubital 05/25/23  2155  Antecubital  1   Peripheral IV 05/26/23 20 G 1" Posterior;Right Hand 05/26/23  0112  Hand  less than 1            Intake/Output Last 24 hours  Intake/Output Summary (Last 24 hours) at 05/26/2023 2130 Last data filed at 05/26/2023 0225 Gross per 24 hour  Intake 350 ml  Output --  Net 350 ml    Labs/Imaging Results for  orders placed or performed during the hospital encounter of 05/25/23 (from the past 48 hour(s))  CBG monitoring, ED     Status: Abnormal   Collection Time: 05/25/23  9:24 PM  Result Value Ref Range   Glucose-Capillary 155 (H) 70 - 99 mg/dL    Comment: Glucose reference range applies only to samples taken after fasting for at least 8 hours.  Comprehensive metabolic panel     Status: Abnormal   Collection Time: 05/25/23  9:29 PM  Result Value Ref Range   Sodium 137 135 - 145 mmol/L   Potassium 3.9 3.5 - 5.1 mmol/L   Chloride 109 98 - 111 mmol/L   CO2 16 (L) 22 - 32 mmol/L   Glucose, Bld 158 (H) 70 - 99 mg/dL    Comment: Glucose reference range applies only to samples taken after fasting for at least 8 hours.   BUN 15 8 - 23 mg/dL   Creatinine, Ser 8.65 (H) 0.44 - 1.00 mg/dL   Calcium 8.8 (L) 8.9 - 10.3 mg/dL   Total Protein 6.1 (L) 6.5 - 8.1 g/dL   Albumin 3.0 (L) 3.5 - 5.0 g/dL   AST 19 15 - 41 U/L   ALT 9 0 - 44 U/L   Alkaline Phosphatase 83 38 - 126 U/L   Total Bilirubin 1.0 0.3 - 1.2 mg/dL   GFR, Estimated 47 (L) >60 mL/min    Comment: (NOTE) Calculated using the CKD-EPI Creatinine Equation (2021)  Anion gap 12 5 - 15    Comment: Performed at [facility], [address], [place], [place] [address]  CBC with Differential     Status: None   Collection Time: 05/25/23  9:29 PM  Result Value Ref Range   WBC 8.6 4.0 - 10.5 K/uL   RBC 4.59 3.87 - 5.11 MIL/uL   Hemoglobin 13.8 12.0 - 15.0 g/dL   HCT 60.4 54.0 - 98.1 %   MCV 95.2 80.0 - 100.0 fL   MCH 30.1 26.0 - 34.0 pg   MCHC 31.6 30.0 - 36.0 g/dL   RDW 19.1 47.8 - 29.5 %   Platelets 233 150 - 400 K/uL   nRBC 0.0 0.0 - 0.2 %   Neutrophils Relative % 76 %   Neutro Abs 6.6 1.7 - 7.7 K/uL   Lymphocytes Relative 16 %   Lymphs Abs 1.4 0.7 - 4.0 K/uL   Monocytes Relative 4 %   Monocytes Absolute 0.3 0.1 - 1.0 K/uL   Eosinophils Relative 2 %   Eosinophils Absolute 0.2 0.0 - 0.5 K/uL   Basophils Relative 1 %    Basophils Absolute 0.1 0.0 - 0.1 K/uL   Immature Granulocytes 1 %   Abs Immature Granulocytes 0.04 0.00 - 0.07 K/uL    Comment: Performed at [facility], [address], [place], [place] [address]  I-stat chem 8, ED     Status: Abnormal   Collection Time: 05/25/23  9:57 PM  Result Value Ref Range   Sodium 140 135 - 145 mmol/L   Potassium 4.0 3.5 - 5.1 mmol/L   Chloride 107 98 - 111 mmol/L   BUN 17 8 - 23 mg/dL   Creatinine, Ser 8.65 0.44 - 1.00 mg/dL   Glucose, Bld 784 (H) 70 - 99 mg/dL    Comment: Glucose reference range applies only to samples taken after fasting for at least 8 hours.   Calcium, Ion 1.20 1.15 - 1.40 mmol/L   TCO2 21 (L) 22 - 32 mmol/L   Hemoglobin 13.9 12.0 - 15.0 g/dL   HCT 69.6 29.5 - 28.4 %  I-Stat Lactic Acid     Status: None   Collection Time: 05/25/23  9:57 PM  Result Value Ref Range   Lactic Acid, Venous 1.8 0.5 - 1.9 mmol/L  Urinalysis, Routine w reflex microscopic -Urine, Catheterized     Status: Abnormal   Collection Time: 05/25/23 10:15 PM  Result Value Ref Range   Color, Urine AMBER (A) YELLOW    Comment: BIOCHEMICALS MAY BE AFFECTED BY COLOR   APPearance HAZY (A) CLEAR   Specific Gravity, Urine 1.023 1.005 - 1.030   pH 5.0 5.0 - 8.0   Glucose, UA NEGATIVE NEGATIVE mg/dL   Hgb urine dipstick NEGATIVE NEGATIVE   Bilirubin Urine NEGATIVE NEGATIVE   Ketones, ur 5 (A) NEGATIVE mg/dL   Protein, ur 30 (A) NEGATIVE mg/dL   Nitrite NEGATIVE NEGATIVE   Leukocytes,Ua NEGATIVE NEGATIVE   RBC / HPF 11-20 0 - 5 RBC/hpf   WBC, UA 6-10 0 - 5 WBC/hpf   Bacteria, UA FEW (A) NONE SEEN   Squamous Epithelial / HPF 0-5 0 - 5 /HPF   Mucus PRESENT    Budding Yeast PRESENT    Hyaline Casts, UA PRESENT    Ca Oxalate Crys, UA PRESENT     Comment: Performed at [facility], [address] [place], [place] [address]  Ammonia     Status: None   Collection  Time: 05/26/23 12:01 AM  Result Value Ref Range   Ammonia <10 9 - 35 umol/L    Comment: Performed  at [facility], [address], [place], [place] [address]  I-Stat venous blood gas, ED     Status: Abnormal   Collection Time: 05/26/23 12:11 AM  Result Value Ref Range   pH, Ven 7.391 7.25 - 7.43   pCO2, Ven 37.6 (L) 44 - 60 mmHg   pO2, Ven 76 (H) 32 - 45 mmHg   Bicarbonate 22.8 20.0 - 28.0 mmol/L   TCO2 24 22 - 32 mmol/L   O2 Saturation 95 %   Acid-base deficit 2.0 0.0 - 2.0 mmol/L   Sodium 141 135 - 145 mmol/L   Potassium 4.3 3.5 - 5.1 mmol/L   Calcium, Ion 1.26 1.15 - 1.40 mmol/L   HCT 41.0 36.0 - 46.0 %   Hemoglobin 13.9 12.0 - 15.0 g/dL   Sample type VENOUS   Urine Culture     Status: None (Preliminary result)   Collection Time: 05/26/23 12:59 AM   Specimen: BLOOD LEFT HAND; Urine  Result Value Ref Range   Specimen Description BLOOD LEFT HAND    Special Requests      BOTTLES DRAWN AEROBIC AND ANAEROBIC Blood Culture results may not be optimal due to an inadequate volume of blood received in culture bottles Performed at [facility], [address], [place], [place] [address]    Culture PENDING    Report Status PENDING   Resp panel by RT-PCR (RSV, Flu A&B, Covid) Anterior Nasal Swab     Status: None   Collection Time: 05/26/23  1:47 AM   Specimen: Anterior Nasal Swab  Result Value Ref Range   SARS Coronavirus 2 by RT PCR NEGATIVE NEGATIVE   Influenza A by PCR NEGATIVE NEGATIVE   Influenza B by PCR NEGATIVE NEGATIVE    Comment: (NOTE) The Xpert Xpress SARS-CoV-2/FLU/RSV plus assay is intended as an aid in the diagnosis of influenza from Nasopharyngeal swab specimens and should not be used as a sole basis for treatment. Nasal washings and aspirates are unacceptable for Xpert Xpress SARS-CoV-2/FLU/RSV testing.  Fact Sheet for Patients: BloggerCourse.com  Fact Sheet for Healthcare Providers: SeriousBroker.it  This test is not yet approved or cleared by the [place] FDA and has been authorized for  detection and/or diagnosis of SARS-CoV-2 by FDA under an Emergency Use Authorization (EUA). This EUA will remain in effect (meaning this test can be used) for the duration of the COVID-19 declaration under Section 564(b)(1) of the Act, 21 U.S.C. section 360bbb-3(b)(1), unless the authorization is terminated or revoked.     Resp Syncytial Virus by PCR NEGATIVE NEGATIVE    Comment: (NOTE) Fact Sheet for Patients: BloggerCourse.com  Fact Sheet for Healthcare Providers: SeriousBroker.it  This test is not yet approved or cleared by the [place] FDA and has been authorized for detection and/or diagnosis of SARS-CoV-2 by FDA under an Emergency Use Authorization (EUA). This EUA will remain in effect (meaning this test can be used) for the duration of the COVID-19 declaration under Section 564(b)(1) of the Act, 21 U.S.C. section 360bbb-3(b)(1), unless the authorization is terminated or revoked.  Performed at [facility], [address] [place], [place] [address]    DG Chest Port 1 View  Result Date: 05/26/2023 CLINICAL DATA:  Fall and altered mental status. EXAM: PORTABLE CHEST 1 VIEW COMPARISON:  07/10/2017 FINDINGS: Again noted is significant dextroscoliosis in lower thoracic spine.  Heart size is within normal limits and stable. Atherosclerotic calcifications at the aortic arch. Evidence for biapical lung scarring. Otherwise, the lungs are clear without focal airspace disease or overt pulmonary edema. Negative for a pneumothorax. IMPRESSION: 1. No acute cardiopulmonary disease. 2. Scoliosis. Electronically Signed   By: Richarda Overlie M.D.   On: 05/26/2023 04:22   CT Cervical Spine Wo Contrast  Result Date: 05/25/2023 CLINICAL DATA:  Neck trauma from fall EXAM: CT CERVICAL SPINE WITHOUT CONTRAST TECHNIQUE: Multidetector CT imaging of the cervical spine was performed without intravenous contrast. Multiplanar CT image reconstructions  were also generated. RADIATION DOSE REDUCTION: This exam was performed according to the departmental dose-optimization program which includes automated exposure control, adjustment of the mA and/or kV according to patient size and/or use of iterative reconstruction technique. COMPARISON:  None Available. FINDINGS: Alignment: No evidence of traumatic malalignment. Skull base and vertebrae: Demineralization. No acute fracture. No primary bone lesion or focal pathologic process. Soft tissues and spinal canal: No prevertebral fluid or swelling. No visible canal hematoma. Disc levels: Age commensurate degenerative spondylosis and facet arthropathy. No severe spinal canal narrowing. Upper chest: No acute abnormality. Other: Carotid calcification. IMPRESSION: No acute fracture in the cervical spine. Electronically Signed   By: Minerva Fester M.D.   On: 05/25/2023 23:19   CT Head Wo Contrast  Result Date: 05/25/2023 CLINICAL DATA:  Fall, trauma. EXAM: CT HEAD WITHOUT CONTRAST TECHNIQUE: Contiguous axial images were obtained from the base of the skull through the vertex without intravenous contrast. RADIATION DOSE REDUCTION: This exam was performed according to the departmental dose-optimization program which includes automated exposure control, adjustment of the mA and/or kV according to patient size and/or use of iterative reconstruction technique. COMPARISON:  None Available. FINDINGS: Brain: No evidence of acute infarction, hemorrhage, hydrocephalus, extra-axial collection or mass lesion/mass effect. There is moderate diffuse atrophy and moderate periventricular white matter hypodensity, likely chronic small vessel ischemic change. Vascular: Atherosclerotic calcifications are present within the cavernous internal carotid arteries. Skull: Normal. Negative for fracture or focal lesion. Sinuses/Orbits: No acute finding. Other: None. IMPRESSION: 1. No acute intracranial abnormality. 2. Moderate diffuse atrophy and  moderate chronic small vessel ischemic change. Electronically Signed   By: Darliss Cheney M.D.   On: 05/25/2023 23:16    Pending Labs Unresulted Labs (From admission, onward)     Start     Ordered   05/26/23 0240  TSH  Once,   R        05/26/23 0240   05/26/23 0036  Culture, blood (routine x 2)  BLOOD CULTURE X 2,   R      05/26/23 0037            Vitals/Pain Today's Vitals   05/26/23 0515 05/26/23 0645 05/26/23 0700 05/26/23 0725  BP:  (!) 162/95    Pulse: 70 66 74   Resp: (!) 21 (!) 22 (!) 21   Temp:    98.2 F (36.8 C)  TempSrc:    Oral  SpO2: 100% 100% 100%     Isolation Precautions No active isolations  Medications Medications  lactated ringers infusion ( Intravenous New Bag/Given (Non-Interop) 05/26/23 0727)  enoxaparin (LOVENOX) injection 40 mg (has no administration in time range)  sodium chloride flush (NS) 0.9 % injection 3 mL (3 mLs Intravenous Given 05/26/23 0230)  acetaminophen (TYLENOL) tablet 1,000 mg (has no administration in time range)  cefTRIAXone (ROCEPHIN) 1 g in sodium chloride 0.9 % 100 mL IVPB (has no administration in time range)  hydrALAZINE (APRESOLINE) injection 5 mg (has no administration in time range)  cefTRIAXone (ROCEPHIN) 1 g in sodium chloride 0.9 % 100 mL IVPB (0 g Intravenous Stopped 05/26/23 0150)  lactated ringers bolus 250 mL (0 mLs Intravenous Stopped 05/26/23 0225)    Mobility walks with device     Focused Assessments    R Recommendations: See Admitting Provider Note  Report given to:   Additional Notes:

## 2023-05-26 NOTE — ED Provider Notes (Signed)
[place] EMERGENCY DEPARTMENT AT [facility] Provider Note   CSN: [id number] Arrival date & time: 05/25/23  2116     History  Chief Complaint  Patient presents with   Fall   Altered Mental Status    Anna Mullins is a 89 y.o. female.  HPI Patient lives home with her daughter and son-in-law.  Baseline she has some dementia but is up and ambulatory and interactive.  Patient's daughter reports over the past day she had decreased appetite and did not really eat anything.  They had not noted any other specific symptoms.  The patient had gone into the laundry room and her daughter found her sitting on the floor poorly responsive.  EMS was called.  EMS reports that the patient was more alert and following some commands with a nonlocalizing exam.  On route however she became more somnolent and poorly responsive.  Patient's heart rate and blood pressures were stable during transport.    Home Medications Prior to Admission medications   Medication Sig Start Date End Date Taking? Authorizing Provider  albuterol (PROVENTIL HFA;VENTOLIN HFA) 108 (90 Base) MCG/ACT inhaler Inhale 1-2 puffs every 6 (six) hours as needed into the lungs for wheezing or shortness of breath. 07/10/17   Domenick Gong, MD  ciprofloxacin (CILOXAN) 0.3 % ophthalmic solution 1-2 drops in affected eye 4 times/day x 5 days 07/10/17   Domenick Gong, MD  fluticasone [facility]) 50 MCG/ACT nasal spray Place 2 sprays daily into both nostrils. 07/10/17   Domenick Gong, MD  ibuprofen (ADVIL,MOTRIN) 200 MG tablet Take 200 mg by mouth every 6 (six) hours as needed.    [provider]  Pseudoephedrine-APAP-DM (DAYQUIL PO) Take by mouth.    [provider]  Spacer/Aero-Holding Chambers (AEROCHAMBER PLUS) inhaler Use as instructed 07/10/17   Domenick Gong, MD      Allergies    Patient has no known allergies.    Review of Systems   Review of Systems  Physical Exam Updated Vital Signs BP (!)  187/84   Pulse 74   Temp (!) 97.4 F (36.3 C) (Axillary)   Resp (!) 26   SpO2 100%  Physical Exam Constitutional:      Comments: On arrival patient was pale and poorly responsive but spontaneous respirations.  No respiratory distress.  Eyes:     Comments: Pupils 3 mm and symmetric.  Cardiovascular:     Rate and Rhythm: Normal rate and regular rhythm.  Pulmonary:     Effort: Pulmonary effort is normal.     Breath sounds: Normal breath sounds.  Abdominal:     General: There is no distension.     Palpations: Abdomen is soft.     Tenderness: There is no abdominal tenderness. There is no guarding.  Musculoskeletal:        General: No swelling or deformity.     Right lower leg: No edema.     Left lower leg: No edema.  Skin:    General: Skin is warm and dry.  Neurological:     Comments: On arrival patient does not respond to stimulus much she is not making any purposeful movements.  She does have a little bit of protective eye blinking.  Breathing spontaneously.     ED Results / Procedures / Treatments   Labs (all labs ordered are listed, but only abnormal results are displayed) Labs Reviewed  COMPREHENSIVE METABOLIC PANEL - Abnormal; Notable for the following components:      Result Value   CO2  16 (*)    Glucose, Bld 158 (*)    Creatinine, Ser 1.07 (*)    Calcium 8.8 (*)    Total Protein 6.1 (*)    Albumin 3.0 (*)    GFR, Estimated 47 (*)    All other components within normal limits  URINALYSIS, ROUTINE W REFLEX MICROSCOPIC - Abnormal; Notable for the following components:   Color, Urine AMBER (*)    APPearance HAZY (*)    Ketones, ur 5 (*)    Protein, ur 30 (*)    Bacteria, UA FEW (*)    All other components within normal limits  CBG MONITORING, ED - Abnormal; Notable for the following components:   Glucose-Capillary 155 (*)    All other components within normal limits  I-STAT CHEM 8, ED - Abnormal; Notable for the following components:   Glucose, Bld 154 (*)     TCO2 21 (*)    All other components within normal limits  I-STAT VENOUS BLOOD GAS, ED - Abnormal; Notable for the following components:   pCO2, Ven 37.6 (*)    pO2, Ven 76 (*)    All other components within normal limits  URINE CULTURE  CULTURE, BLOOD (ROUTINE X 2)  CULTURE, BLOOD (ROUTINE X 2)  RESP PANEL BY RT-PCR (RSV, FLU A&B, COVID)  [id number]  CBC WITH DIFFERENTIAL/PLATELET  AMMONIA  I-STAT CG4 LACTIC ACID, ED    EKG EKG Interpretation Date/Time:  Thursday May 25 2023 21:21:43 EDT Ventricular Rate:  68 PR Interval:  134 QRS Duration:  91 QT Interval:  404 QTC Calculation: 430 R Axis:   63  Text Interpretation: Sinus rhythm no acute ischemic appearance. normal. no old comparison Confirmed by Arby Barrette [phone]) on 05/26/2023 1:40:01 AM  Radiology CT Cervical Spine Wo Contrast  Result Date: 05/25/2023 CLINICAL DATA:  Neck trauma from fall EXAM: CT CERVICAL SPINE WITHOUT CONTRAST TECHNIQUE: Multidetector CT imaging of the cervical spine was performed without intravenous contrast. Multiplanar CT image reconstructions were also generated. RADIATION DOSE REDUCTION: This exam was performed according to the departmental dose-optimization program which includes automated exposure control, adjustment of the mA and/or kV according to patient size and/or use of iterative reconstruction technique. COMPARISON:  None Available. FINDINGS: Alignment: No evidence of traumatic malalignment. Skull base and vertebrae: Demineralization. No acute fracture. No primary bone lesion or focal pathologic process. Soft tissues and spinal canal: No prevertebral fluid or swelling. No visible canal hematoma. Disc levels: Age commensurate degenerative spondylosis and facet arthropathy. No severe spinal canal narrowing. Upper chest: No acute abnormality. Other: Carotid calcification. IMPRESSION: No acute fracture in the cervical spine. Electronically Signed   By: Minerva Fester M.D.   On: 05/25/2023 23:19    CT Head Wo Contrast  Result Date: 05/25/2023 CLINICAL DATA:  Fall, trauma. EXAM: CT HEAD WITHOUT CONTRAST TECHNIQUE: Contiguous axial images were obtained from the base of the skull through the vertex without intravenous contrast. RADIATION DOSE REDUCTION: This exam was performed according to the departmental dose-optimization program which includes automated exposure control, adjustment of the mA and/or kV according to patient size and/or use of iterative reconstruction technique. COMPARISON:  None Available. FINDINGS: Brain: No evidence of acute infarction, hemorrhage, hydrocephalus, extra-axial collection or mass lesion/mass effect. There is moderate diffuse atrophy and moderate periventricular white matter hypodensity, likely chronic small vessel ischemic change. Vascular: Atherosclerotic calcifications are present within the cavernous internal carotid arteries. Skull: Normal. Negative for fracture or focal lesion. Sinuses/Orbits: No acute finding. Other: None. IMPRESSION: 1. No acute intracranial  abnormality. 2. Moderate diffuse atrophy and moderate chronic small vessel ischemic change. Electronically Signed   By: Darliss Cheney M.D.   On: 05/25/2023 23:16    Procedures Procedures    Medications Ordered in ED Medications  lactated ringers infusion ( Intravenous New Bag/Given 05/25/23 2259)  cefTRIAXone (ROCEPHIN) 1 g in sodium chloride 0.9 % 100 mL IVPB (1 g Intravenous New Bag/Given 05/26/23 0120)  lactated ringers bolus 250 mL (has no administration in time range)    ED Course/ Medical Decision Making/ A&P                                 Medical Decision Making Amount and/or Complexity of Data Reviewed Labs: ordered. Radiology: ordered.  Risk Prescription drug management.   Patient presents as outlined.  Patient's daughter identified subtle changes of decreased appetite over the past day.  Patient then had an episode of possible fall versus collapse.  She was found in a semiseated  position on the floor.  No obvious trauma on exam.  Patient seemed to have some declining mental status during transport with increasing somnolence or obtundation.  Differential diagnosis includes head injury with intracerebral bleed\stroke\hypoglycemia\metabolic derangement\dysrhythmia.  On arrival patient has rate controlled sinus rhythm narrow complex.  No dysrhythmia on monitor.  Blood pressures are normotensive and have crept up to mild hypertension.  Oxygen saturation has been consistently 100% without supplemental oxygen.  Despite being unresponsive, vital signs have been normal.  CT scan of the head visually reviewed by myself and reviewed by radiology no acute findings.  No intracerebral bleed present.  Urinalysis positive with 6-10 WBCs 11-20 RBCs.  GFR 47.  Normal white count normal H&H normal differential.  Patient was hypothermic.  She was placed on [employer]  She has gotten lactated ringer maintenance and small bolus.  Rocephin initiated for suspected UTI.  Upon recheck, patient has made improvements she has now done some spontaneous eye opening and smiled a bit.  She has nodded her head to her daughter.  Showing some distinct improvement.  At this time we will plan for admission for ongoing hydration and antibiotics with observation.  I did discuss the patient's CODE STATUS with the patient's daughter.  She advises that they would not want resuscitation or extreme measures.  They would like treatment for infection, dehydration or other reversible conditions.  Patient is DNR status updated.  Consult: [facility] hospitalist for admission Dr. Valda Lamb        Final Clinical Impression(s) / ED Diagnoses Final diagnoses:  Lower urinary tract infectious disease  Altered mental status, unspecified altered mental status type    Rx / DC Orders ED Discharge Orders     None         Arby Barrette, MD 05/26/23 [phone]

## 2023-05-26 NOTE — ED Notes (Signed)
Pt transported to unit via stretcher. VSS/NAD.

## 2023-05-26 NOTE — Progress Notes (Signed)
Patient is moving to 6N shortly. EEG to be placed when she arrives.

## 2023-05-26 NOTE — Care Management Obs Status (Signed)
MEDICARE OBSERVATION STATUS NOTIFICATION   Patient Details  Name: Anna Mullins MRN: [id number] Date of Birth: Nov 22, 1923   Medicare Observation Status Notification Given:  Yes    Kingsley Plan, RN 05/26/2023, 2:39 PM

## 2023-05-26 NOTE — ED Notes (Signed)
Bair hugger turned off.

## 2023-05-26 NOTE — Progress Notes (Signed)
EEG complete - results pending 

## 2023-05-26 NOTE — Evaluation (Signed)
Physical Therapy Evaluation Patient Details Name: Anna Mullins MRN: [id number] DOB: [birth year] Today's Date: 05/26/2023  History of Present Illness  Patient is a 89 year old with fall and AMS. Found down at home. Found to have encephalopathy, suspect secondary to urinary tract infection. History of dementia and recent failure to thrive.    Clinical Impression  Patient is agreeable to PT evaluation. Supportive daughter at the bedside. The patient lives in a home adjoined with her daughter's home. Her living area has a ramped entrance and there is DME in place. Patient is ambulatory without assistive device with a shuffled gait at baseline. Daughter assists with ADLs at home.  Today, the patient required minimal assistance for bed mobility. Several standing bouts performed from various surfaces including bed and toilet with contact guard assistance and cues for technique. With hand held assistance, patient walked to and from the toilet. She needs assistance for toileting tasks and was incontinent of bowel and bladder during the walk. The daughter is very hopeful to bring the patient home at discharge. Recommend PT follow up to maximize independence and decrease caregiver burden.       If plan is discharge home, recommend the following: A little help with walking and/or transfers;A little help with bathing/dressing/bathroom;Assistance with cooking/housework;Assist for transportation;Help with stairs or ramp for entrance;Supervision due to cognitive status;Direct supervision/assist for medications management;Direct supervision/assist for financial management   Can travel by private vehicle        Equipment Recommendations None recommended by PT  Recommendations for Other Services       Functional Status Assessment Patient has had a recent decline in their functional status and demonstrates the ability to make significant improvements in function in a reasonable and predictable amount of time.      Precautions / Restrictions Precautions Precautions: Fall Restrictions Weight Bearing Restrictions: No      Mobility  Bed Mobility Overal bed mobility: Needs Assistance Bed Mobility: Supine to Sit, Sit to Supine     Supine to sit: Min assist Sit to supine: Min assist   General bed mobility comments: assistance for LE support. verbal cues for task initiation    Transfers Overall transfer level: Needs assistance Equipment used: 1 person hand held assist Transfers: Sit to/from Stand Sit to Stand: Contact guard assist           General transfer comment: CGA for stand from bed and from toilet. verbal cues for task initiation and sequencing. multiple standing bouts performed with rest break between bouts of activity    Ambulation/Gait Ambulation/Gait assistance: Contact guard assist Gait Distance (Feet): 35 Feet Assistive device: 1 person hand held assist Gait Pattern/deviations: Step-to pattern, Decreased stride length, Shuffle Gait velocity: decreased     General Gait Details: hand held assistance and cues for safety. patient may benefit from trial of rolling walker (historically has preferred not to use at home per daughter report). patient also with bowel and bladder incontinence with ambulation and usually wears brief at home  Stairs            Wheelchair Mobility     Tilt Bed    Modified Rankin (Stroke Patients Only)       Balance Overall balance assessment: Needs assistance Sitting-balance support: Feet supported Sitting balance-Leahy Scale: Fair     Standing balance support: Single extremity supported Standing balance-Leahy Scale: Fair  Pertinent Vitals/Pain Pain Assessment Pain Assessment: No/denies pain    Home Living Family/patient expects to be discharged to:: Private residence Living Arrangements: Children (daughter's house is connected to her home with one step to enter) Available Help at  Discharge: Family Type of Home: House Home Access: Ramped entrance       Home Layout: One level Home Equipment: Agricultural consultant (2 wheels);Cane - quad;BSC/3in1;Wheelchair - manual      Prior Function Prior Level of Function : Needs assist;History of Falls (last six months)       Physical Assist : ADLs (physical)   ADLs (physical): Bathing;Dressing;Toileting;Grooming Mobility Comments: shuffled walking, limited distances. no other recent falls. no DME with ambulation becuase patient prefers not to use the walker or cane ADLs Comments: daughter provides all meals and assists with ADLs at baseline.     Extremity/Trunk Assessment   Upper Extremity Assessment Upper Extremity Assessment: Generalized weakness    Lower Extremity Assessment Lower Extremity Assessment: Generalized weakness    Cervical / Trunk Assessment Cervical / Trunk Assessment:  (scoliosis appearance)  Communication   Communication Communication: Difficulty following commands/understanding Following commands: Follows one step commands with increased time;Follows one step commands inconsistently Cueing Techniques: Verbal cues;Tactile cues  Cognition Arousal: Alert Behavior During Therapy: Flat affect Overall Cognitive Status: History of cognitive impairments - at baseline                                 General Comments: patient is oriented to self. she recognizes her daughter. she is able to follow single step commands with increased time and repetion most of the time        General Comments General comments (skin integrity, edema, etc.): no dizziness is reported with activity. maximal assistance required with tolieting tasks    Exercises     Assessment/Plan    PT Assessment Patient needs continued PT services  PT Problem List Decreased strength;Decreased activity tolerance;Decreased balance;Decreased cognition;Decreased mobility;Decreased safety awareness       PT Treatment  Interventions DME instruction;Gait training;Stair training;Functional mobility training;Therapeutic activities;Therapeutic exercise;Balance training;Neuromuscular re-education;Cognitive remediation;Patient/family education;Wheelchair mobility training    PT Goals (Current goals can be found in the Care Plan section)  Acute Rehab PT Goals Patient Stated Goal: to go home PT Goal Formulation: With family Time For Goal Achievement: 06/02/23 Potential to Achieve Goals: Good    Frequency Min 1X/week     Co-evaluation               AM-PAC PT "6 Clicks" Mobility  Outcome Measure Help needed turning from your back to your side while in a flat bed without using bedrails?: None Help needed moving from lying on your back to sitting on the side of a flat bed without using bedrails?: A Little Help needed moving to and from a bed to a chair (including a wheelchair)?: A Little Help needed standing up from a chair using your arms (e.g., wheelchair or bedside chair)?: A Little Help needed to walk in hospital room?: A Little Help needed climbing 3-5 steps with a railing? : A Lot 6 Click Score: 18    End of Session   Activity Tolerance: Patient tolerated treatment well Patient left: in bed;with call bell/phone within reach;with bed alarm set;with family/visitor present Nurse Communication: Mobility status PT Visit Diagnosis: Unsteadiness on feet (R26.81);Muscle weakness (generalized) (M62.81)    Time: 1610-9604 PT Time Calculation (min) (ACUTE ONLY): 43 min   Charges:  PT Evaluation $PT Eval Moderate Complexity: 1 Mod PT Treatments $Therapeutic Activity: 8-22 mins PT General Charges $$ ACUTE PT VISIT: 1 Visit         Donna Bernard, PT, MPT   Ina Homes 05/26/2023, 2:46 PM

## 2023-05-26 NOTE — TOC Initial Note (Addendum)
Transition of Care (TOC) - Initial/Assessment Note   Spoke to patient and daughter sandra at bedside.   Patient lives in apartment connected to daughter and son in law's home. Daughter can provide assistance at home. PT recommending HHPT . Daughter asking if an aide can be ordered also to assist with bathing some.   Spoke to [place] with [facility]. She can accept referral for HHPT/OT/aide, will need orders and face to face.  [facility] will submit to [facility] Medicare for authorization. Of course insurance will need to approve , aide likely be 1 to 2 times a week for bath only. Dois Davenport voiced understanding   Entered orders and secure chatted MD to sign   Patient has DME at home. However, patient currently on oxygen and does NOT have home oxygen  Patient Details  Name: Anna Mullins MRN: [id number] Date of Birth: [birth year]  Transition of Care [facility]) CM/SW Contact:    Kingsley Plan, RN Phone Number: 05/26/2023, 2:52 PM  Clinical Narrative:                   Expected Discharge Plan: Home w Home Health Services Barriers to Discharge: Continued Medical Work up   Patient Goals and CMS Choice Patient states their goals for this hospitalization and ongoing recovery are:: to return to home CMS Medicare.gov Compare Post Acute Care list provided to:: Patient Represenative (must comment) (daughter) Choice offered to / list presented to : Adult Children      Expected Discharge Plan and Services   Discharge Planning Services: CM Consult Post Acute Care Choice: Home Health Living arrangements for the past 2 months: Single Family Home                 DME Arranged: N/A         HH Arranged: PT, OT, Nurse's Aide HH Agency: [facility] Date HH Agency Contacted: 05/26/23 Time HH Agency Contacted: 1450 Representative spoke with at [facility] Agency: Tresa Endo  Prior Living Arrangements/Services Living arrangements for the past 2 months: Single Family Home Lives with::  (see note) Patient  language and need for interpreter reviewed:: Yes Do you feel safe going back to the place where you live?: Yes      Need for Family Participation in Patient Care: Yes (Comment) Care giver support system in place?: Yes (comment) Current home services: DME Criminal Activity/Legal Involvement Pertinent to Current Situation/Hospitalization: No - Comment as needed  Activities of Daily Living      Permission Sought/Granted   Permission granted to share information with : Yes, Verbal Permission Granted  Share Information with NAME: daughter Dois Davenport  Permission granted to share info w AGENCY: [facility]        Emotional Assessment Appearance:: Appears stated age       Alcohol / Substance Use: Not Applicable Psych Involvement: No (comment)  Admission diagnosis:  Lower urinary tract infectious disease [N39.0] Urinary tract infection [N39.0] Altered mental status, unspecified altered mental status type [R41.82] Patient Active Problem List   Diagnosis Date Noted   Urinary tract infection 05/26/2023   Acute encephalopathy 05/26/2023   Ground-level fall 05/26/2023   PCP:  Noberto Retort, MD Pharmacy:   [facility] [id number] ([address]), [place] - [address] [place] ([place]) [place] [address] Phone: [phone] Fax: [phone]  [id number] - [place], [place] - [address] [place] [address] Phone: [phone] Fax: [phone]  Social Determinants of Health (SDOH) Social History: SDOH Screenings   Tobacco Use: Low Risk  (09/10/2020)   SDOH Interventions:     Readmission Risk Interventions     No data to display

## 2023-05-26 NOTE — H&P (Addendum)
History and Physical    Anna Mullins WUJ:[id number] DOB: 04/23/1924 DOA: 05/25/2023  PCP: Noberto Retort, MD   Patient coming from: Home   Chief Complaint:  Chief Complaint  Patient presents with   Fall   Altered Mental Status    HPI: History is provided by patient's daughter at the bedside due to patient's altered mental status Anna Mullins is a 89 y.o. female with hx of dementia, hypertension, diabetes, who was brought in by EMS after found down at home.  Patient lives in a connected home to her family.  At baseline she is disoriented, although able to have limited conversation, she is semidependent on ADLs.  Able to feed herself, transfer.  But has help with things like toileting.  She does not use any DME.  Over the past few weeks has been having decreased oral intake.  And in the past few days much more somnolent and sleeping most of the time.  Then today daughter found her on the ground in the laundry room.  Unknown if any syncope.  No obvious injuries following.  Daughter denies any recent illness, fevers, chest pain, shortness of breath, abdominal pain, nausea, vomiting, dysuria -at least to the extent that the patient had not complained of any.  No recent falls preceding the one today.    Review of Systems:  ROS complete and negative except as marked above   No Known Allergies  Prior to Admission medications   Medication Sig Start Date End Date Taking? Authorizing Provider  albuterol (PROVENTIL HFA;VENTOLIN HFA) 108 (90 Base) MCG/ACT inhaler Inhale 1-2 puffs every 6 (six) hours as needed into the lungs for wheezing or shortness of breath. 07/10/17   Domenick Gong, MD  ciprofloxacin (CILOXAN) 0.3 % ophthalmic solution 1-2 drops in affected eye 4 times/day x 5 days 07/10/17   Domenick Gong, MD  fluticasone [facility]) 50 MCG/ACT nasal spray Place 2 sprays daily into both nostrils. 07/10/17   Domenick Gong, MD  ibuprofen (ADVIL,MOTRIN) 200 MG tablet Take 200 mg by mouth  every 6 (six) hours as needed.    [provider]  Pseudoephedrine-APAP-DM (DAYQUIL PO) Take by mouth.    [provider]  Spacer/Aero-Holding Chambers (AEROCHAMBER PLUS) inhaler Use as instructed 07/10/17   Domenick Gong, MD    Past Medical History:  Diagnosis Date   Pelvic fracture [facility])     Past Surgical History:  Procedure Laterality Date   CATARACT EXTRACTION       reports that she has never smoked. She has never used smokeless tobacco. She reports that she does not drink alcohol and does not use drugs.  No family history on file.   Physical Exam: Vitals:   05/26/23 0200 05/26/23 0225 05/26/23 0230 05/26/23 0300  BP: (!) 184/71  (!) 185/70 (!) 209/82  Pulse: 72  75 86  Resp: (!) 24  (!) 33 20  Temp:  98.1 F (36.7 C)    TempSrc:  Oral    SpO2: 98%  100% 100%    Gen: Somnolent, arouses to moderate but nonpainful stimuli, then awake and attentive to examiner.  She is chronically ill appearing, elderly and frail. HEENT: Head atraumatic CV: Regular, normal S1, S2, 1 out of 6 SEM Resp: Normal WOB, CTAB  Abd: Flat, normoactive, nontender MSK: Symmetric, no edema.  Extremities atraumatic Skin: Scattered ecchymosis.  No other rashes or lesions to exposed skin Neuro: Somnolent, arouses to moderate but nonpainful stimuli, then awake and attentive to examiner.  She is oriented to  hospital and [facility].  Otherwise does not respond to orientation questions.  No apparent focal deficit. Psych: Unable to assess  Data review:   Labs reviewed, notable for:  Lactate 1.8 VBG no hypercarbia Ammonia undetectable Bicarb 16, anion gap 12 WBC 8 UA with few bacteria, pyuria no leukocytes or nitrites.  Micro:  Results for orders placed or performed during the hospital encounter of 05/25/23  Urine Culture     Status: None (Preliminary result)   Collection Time: 05/26/23 12:59 AM   Specimen: BLOOD LEFT HAND; Urine  Result Value Ref Range Status   Specimen  Description BLOOD LEFT HAND  Final   Special Requests   Final    BOTTLES DRAWN AEROBIC AND ANAEROBIC Blood Culture results may not be optimal due to an inadequate volume of blood received in culture bottles Performed at [facility], [address], [place], [place] [address]    Culture PENDING  Incomplete   Report Status PENDING  Incomplete  Resp panel by RT-PCR (RSV, Flu A&B, Covid) Anterior Nasal Swab     Status: None   Collection Time: 05/26/23  1:47 AM   Specimen: Anterior Nasal Swab  Result Value Ref Range Status   SARS Coronavirus 2 by RT PCR NEGATIVE NEGATIVE Final   Influenza A by PCR NEGATIVE NEGATIVE Final   Influenza B by PCR NEGATIVE NEGATIVE Final    Comment: (NOTE) The Xpert Xpress SARS-CoV-2/FLU/RSV plus assay is intended as an aid in the diagnosis of influenza from Nasopharyngeal swab specimens and should not be used as a sole basis for treatment. Nasal washings and aspirates are unacceptable for Xpert Xpress SARS-CoV-2/FLU/RSV testing.  Fact Sheet for Patients: BloggerCourse.com  Fact Sheet for Healthcare Providers: SeriousBroker.it  This test is not yet approved or cleared by the [place] FDA and has been authorized for detection and/or diagnosis of SARS-CoV-2 by FDA under an Emergency Use Authorization (EUA). This EUA will remain in effect (meaning this test can be used) for the duration of the COVID-19 declaration under Section 564(b)(1) of the Act, 21 U.S.C. section 360bbb-3(b)(1), unless the authorization is terminated or revoked.     Resp Syncytial Virus by PCR NEGATIVE NEGATIVE Final    Comment: (NOTE) Fact Sheet for Patients: BloggerCourse.com  Fact Sheet for Healthcare Providers: SeriousBroker.it  This test is not yet approved or cleared by the [place] FDA and has been authorized for detection and/or diagnosis of SARS-CoV-2 by FDA  under an Emergency Use Authorization (EUA). This EUA will remain in effect (meaning this test can be used) for the duration of the COVID-19 declaration under Section 564(b)(1) of the Act, 21 U.S.C. section 360bbb-3(b)(1), unless the authorization is terminated or revoked.  Performed at [facility], [address], [place], [place] [address]     Imaging reviewed:  [facility] Chest Port 1 View  Result Date: 05/26/2023 CLINICAL DATA:  Fall and altered mental status. EXAM: PORTABLE CHEST 1 VIEW COMPARISON:  07/10/2017 FINDINGS: Again noted is significant dextroscoliosis in lower thoracic spine. Heart size is within normal limits and stable. Atherosclerotic calcifications at the aortic arch. Evidence for biapical lung scarring. Otherwise, the lungs are clear without focal airspace disease or overt pulmonary edema. Negative for a pneumothorax. IMPRESSION: 1. No acute cardiopulmonary disease. 2. Scoliosis. Electronically Signed   By: Richarda Overlie M.D.   On: 05/26/2023 04:22   CT Cervical Spine Wo Contrast  Result Date: 05/25/2023 CLINICAL DATA:  Neck trauma from fall EXAM: CT CERVICAL SPINE WITHOUT CONTRAST  TECHNIQUE: Multidetector CT imaging of the cervical spine was performed without intravenous contrast. Multiplanar CT image reconstructions were also generated. RADIATION DOSE REDUCTION: This exam was performed according to the departmental dose-optimization program which includes automated exposure control, adjustment of the mA and/or kV according to patient size and/or use of iterative reconstruction technique. COMPARISON:  None Available. FINDINGS: Alignment: No evidence of traumatic malalignment. Skull base and vertebrae: Demineralization. No acute fracture. No primary bone lesion or focal pathologic process. Soft tissues and spinal canal: No prevertebral fluid or swelling. No visible canal hematoma. Disc levels: Age commensurate degenerative spondylosis and facet arthropathy. No severe spinal canal  narrowing. Upper chest: No acute abnormality. Other: Carotid calcification. IMPRESSION: No acute fracture in the cervical spine. Electronically Signed   By: Minerva Fester M.D.   On: 05/25/2023 23:19   CT Head Wo Contrast  Result Date: 05/25/2023 CLINICAL DATA:  Fall, trauma. EXAM: CT HEAD WITHOUT CONTRAST TECHNIQUE: Contiguous axial images were obtained from the base of the skull through the vertex without intravenous contrast. RADIATION DOSE REDUCTION: This exam was performed according to the departmental dose-optimization program which includes automated exposure control, adjustment of the mA and/or kV according to patient size and/or use of iterative reconstruction technique. COMPARISON:  None Available. FINDINGS: Brain: No evidence of acute infarction, hemorrhage, hydrocephalus, extra-axial collection or mass lesion/mass effect. There is moderate diffuse atrophy and moderate periventricular white matter hypodensity, likely chronic small vessel ischemic change. Vascular: Atherosclerotic calcifications are present within the cavernous internal carotid arteries. Skull: Normal. Negative for fracture or focal lesion. Sinuses/Orbits: No acute finding. Other: None. IMPRESSION: 1. No acute intracranial abnormality. 2. Moderate diffuse atrophy and moderate chronic small vessel ischemic change. Electronically Signed   By: Darliss Cheney M.D.   On: 05/25/2023 23:16    EKG: Sinus rhythm with PACs, no ischemic changes.  ED Course:  Treated with ceftriaxone, 250 cc IV fluid and rate at 125/h Placed on a Bair hugger for mild hypothermia 36C, taken off before admission   Assessment/Plan:  89 y.o. female with hx dementia, hypertension, diabetes, who was brought in by EMS after found down at home.  Noted to have encephalopathy, suspect secondary to urinary tract infection.  Ground-level fall Unknown if syncope -Tele monitoring -PT evaluation, daughter prefers to keep patient at home if at all  possible.  Encephalopathy, suspect secondary to urinary tract infection -improving Urinary tract infection Hypothermia, mild-resolved -Continue ceftriaxone 1 g IV every 24 hours -Follow urine culture  Recent failure to thrive History dementia Goals of care Likely worse in the setting of infection per above.  However with her advanced age and dementia, may have progressive failure to thrive and decreased intake. -Currently DNR/DNI.  Can involve the patient in discussions about hospice care, they seem to value her time at home.  And as she becomes more debilitated and with her dementia feels she would be a candidate.   Severe asymptomatic hypertension She has known hypertension but is not on antihypertensive medications because she refuses to take them. -For now can continue monitoring of blood pressure without antihypertensive therapy.    Chronic medical problems: Diabetes: Not on diabetic medications, liberal goals with her age and comorbidities.  There is no height or weight on file to calculate BMI.    DVT prophylaxis:  Lovenox Code Status:  DNR/DNI(Do NOT Intubate) Diet:  Diet Orders (From admission, onward)     Start     Ordered   05/26/23 0222  Diet regular Room service appropriate? Yes;  Fluid consistency: Thin  Diet effective now       Question Answer Comment  Room service appropriate? Yes   Fluid consistency: Thin      05/26/23 0223           Family Communication: Yes discussed with daughter Consults: None Admission status:   Observation, Telemetry bed  Severity of Illness: The appropriate patient status for this patient is OBSERVATION. Observation status is judged to be reasonable and necessary in order to provide the required intensity of service to ensure the patient's safety. The patient's presenting symptoms, physical exam findings, and initial radiographic and laboratory data in the context of their medical condition is felt to place them at decreased risk  for further clinical deterioration. Furthermore, it is anticipated that the patient will be medically stable for discharge from the hospital within 2 midnights of admission.    Dolly Rias, MD [facility]  How to contact the [facility] Attending or Consulting provider 7A - 7P or covering provider during after hours 7P -7A, for this patient.  Check the care team in [facility] and look for a) attending/consulting TRH provider listed and b) the [facility] team listed Log into [email or web address] and use [facility]'s universal password to access. If you do not have the password, please contact the hospital operator. Locate the [facility] provider you are looking for under [facility] and page to a number that you can be directly reached. If you still have difficulty reaching the provider, please page the [facility] (Director on Call) for the Hospitalists listed on amion for assistance.  05/26/2023, 5:14 AM

## 2023-05-26 NOTE — Procedures (Signed)
Patient Name: Anna Mullins  MRN: [id number]  Epilepsy Attending: Charlsie Quest  Referring Physician/Provider: Osvaldo Shipper, MD  Date: 05/26/2023 Duration: 23.21 mins  Patient history: 89yo F with ams getting eeg to evaluate for seizure  Level of alertness: Awake  AEDs during EEG study: None  Technical aspects: This EEG study was done with scalp electrodes positioned according to the 10-20 International system of electrode placement. Electrical activity was reviewed with band pass filter of 1-70Hz , sensitivity of 7 uV/mm, display speed of 8mm/sec with a 60Hz  notched filter applied as appropriate. EEG data were recorded continuously and digitally stored.  Video monitoring was available and reviewed as appropriate.  Description: The posterior dominant rhythm consists of 7 Hz activity of moderate voltage (25-35 uV) seen predominantly in posterior head regions, symmetric and reactive to eye opening and eye closing. EEG showed continuous generalized and lateralized right hemisphere 3 to 6 Hz theta-delta slowing. Hyperventilation and photic stimulation were not performed.     ABNORMALITY - Continuous slow, generalized and lateralized right hemisphere   IMPRESSION: This study is suggestive of cortical dysfunction arising from right hemisphere likely secondary to underlying structural abnormality. Additionally there is moderate diffuse encephalopathy. No seizures or epileptiform discharges were seen throughout the recording.  Mancil Pfenning Annabelle Harman

## 2023-05-26 NOTE — ED Notes (Signed)
Segard, MD aware of current BP and okay with monitoring for now and rechecking in 1 hour.

## 2023-05-26 NOTE — Progress Notes (Addendum)
[facility] PROGRESS NOTE   Anna Mullins ZOX:[id number] DOB: 06/09/1924 DOA: 05/25/2023  PCP: Noberto Retort, MD  Brief History: 89 y.o. female with hx of dementia, hypertension, diabetes, who was brought in by EMS after found down at home.  Patient lives in a connected home to her daughter and son-in-law.  At baseline she is disoriented, although able to have limited conversation, she is semidependent on ADLs.  Able to feed herself, transfer.  But has help with things like toileting.  She does not use any DME.  Over the past few weeks has been having decreased oral intake.  And in the past few days much more somnolent and sleeping most of the time.  Then today daughter found her on the ground in the laundry room.  Unknown if any syncope.  She was hospitalized for further management.  Consultants: None  Procedures: None    Subjective/Interval History: Patient is awake.  Distracted.  Disoriented.  Denies any pain.    Assessment/Plan:  Ground-level fall Etiology unclear.  Unknown if she had a syncopal episode.  No obvious injuries identified on examination.  PT and OT evaluation.  EKG without any concerning findings.  Hold off on echocardiogram.  Acute encephalopathy Etiology unclear.  Initially thought to be due to UTI however UA does not suggest the same.  She is empirically on ceftriaxone.  Will give a 3-day course.  Urine culture has been ordered.  Will wait for those results as well. Mentation appears to have improved.  CT head did not show any acute findings but did show atrophy. Reasonable to obtain EEG considering worsening confusion brain atrophy and mild metabolic acidosis.  History of dementia According to H&P patient has had decreased oral intake in the last few weeks.  We will discuss with family today.  Accelerated hypertension Not on any antihypertensive medications since she has refused to take them previously. As needed medications for now.  Diabetes  mellitus type 2 Not on any medications for same.  Due to advanced age we will not be strict with her diet or glycemic control.  DVT Prophylaxis: Lovenox Code Status: DNR Family Communication: Will call her daughter Disposition Plan: To be determined  Status is: Observation The patient will require care spanning > 2 midnights and should be moved to inpatient because: Acute encephalopathy      Medications: Scheduled:  enoxaparin (LOVENOX) injection  40 mg Subcutaneous Q24H   sodium chloride flush  3 mL Intravenous Q12H   Continuous:  [START ON 05/27/2023] cefTRIAXone (ROCEPHIN)  IV     lactated ringers 125 mL/hr at 05/26/23 0727   WJX:[id number], hydrALAZINE  Antibiotics: Anti-infectives (From admission, onward)    Start     Dose/Rate Route Frequency Ordered Stop   05/27/23 0000  cefTRIAXone (ROCEPHIN) 1 g in sodium chloride 0.9 % 100 mL IVPB        1 g 200 mL/hr over 30 Minutes Intravenous Every 24 hours 05/26/23 0224     05/26/23 0045  cefTRIAXone (ROCEPHIN) 1 g in sodium chloride 0.9 % 100 mL IVPB        1 g 200 mL/hr over 30 Minutes Intravenous  Once 05/26/23 0037 05/26/23 0150       Objective:  Vital Signs  Vitals:   05/26/23 0645 05/26/23 0700 05/26/23 0725 05/26/23 0800  BP: (!) 162/95   (!) 160/61  Pulse: 66 74  (!) 104  Resp: (!) 22 (!) 21  18  Temp:   98.2 F (36.8 C)  TempSrc:   Oral   SpO2: 100% 100%  100%    Intake/Output Summary (Last 24 hours) at 05/26/2023 1018 Last data filed at 05/26/2023 0225 Gross per 24 hour  Intake 350 ml  Output --  Net 350 ml   There were no vitals filed for this visit.  General appearance: Awake alert.  In no distress.  Confused Resp: Clear to auscultation bilaterally.  Normal effort Cardio: S1-S2 is normal regular.  No S3-S4.  No rubs murmurs or bruit GI: Abdomen is soft.  Nontender nondistended.  Bowel sounds are present normal.  No masses organomegaly Extremities: No edema.  Able to move all of her  extremities. She is disoriented.  No obvious focal neurological deficits noted.   Lab Results:  Data Reviewed: I have personally reviewed following labs and reports of the imaging studies  CBC: Recent Labs  Lab 05/25/23 2129 05/25/23 2157 05/26/23 0011  WBC 8.6  --   --   NEUTROABS 6.6  --   --   HGB 13.8 13.9 13.9  HCT 43.7 41.0 41.0  MCV 95.2  --   --   PLT 233  --   --     Basic Metabolic Panel: Recent Labs  Lab 05/25/23 2129 05/25/23 2157 05/26/23 0011  NA 137 140 141  K 3.9 4.0 4.3  CL 109 107  --   CO2 16*  --   --   GLUCOSE 158* 154*  --   BUN 15 17  --   CREATININE 1.07* 1.00  --   CALCIUM 8.8*  --   --     GFR: CrCl cannot be calculated (Unknown ideal weight.).  Liver Function Tests: Recent Labs  Lab 05/25/23 2129  AST 19  ALT 9  ALKPHOS 83  BILITOT 1.0  PROT 6.1*  ALBUMIN 3.0*    Recent Labs  Lab 05/26/23 0001  AMMONIA <10    CBG: Recent Labs  Lab 05/25/23 2124  GLUCAP 155*    Recent Results (from the past 240 hour(s))  Urine Culture     Status: None (Preliminary result)   Collection Time: 05/26/23 12:59 AM   Specimen: BLOOD LEFT HAND; Urine  Result Value Ref Range Status   Specimen Description BLOOD LEFT HAND  Final   Special Requests   Final    BOTTLES DRAWN AEROBIC AND ANAEROBIC Blood Culture results may not be optimal due to an inadequate volume of blood received in culture bottles Performed at [facility], [address], [place], [place] [address]    Culture PENDING  Incomplete   Report Status PENDING  Incomplete  Resp panel by RT-PCR (RSV, Flu A&B, Covid) Anterior Nasal Swab     Status: None   Collection Time: 05/26/23  1:47 AM   Specimen: Anterior Nasal Swab  Result Value Ref Range Status   SARS Coronavirus 2 by RT PCR NEGATIVE NEGATIVE Final   Influenza A by PCR NEGATIVE NEGATIVE Final   Influenza B by PCR NEGATIVE NEGATIVE Final    Comment: (NOTE) The Xpert Xpress SARS-CoV-2/FLU/RSV plus assay is intended  as an aid in the diagnosis of influenza from Nasopharyngeal swab specimens and should not be used as a sole basis for treatment. Nasal washings and aspirates are unacceptable for Xpert Xpress SARS-CoV-2/FLU/RSV testing.  Fact Sheet for Patients: BloggerCourse.com  Fact Sheet for Healthcare Providers: SeriousBroker.it  This test is not yet approved or cleared by the [place] FDA and has been authorized for detection and/or diagnosis of SARS-CoV-2  by FDA under an Emergency Use Authorization (EUA). This EUA will remain in effect (meaning this test can be used) for the duration of the COVID-19 declaration under Section 564(b)(1) of the Act, 21 U.S.C. section 360bbb-3(b)(1), unless the authorization is terminated or revoked.     Resp Syncytial Virus by PCR NEGATIVE NEGATIVE Final    Comment: (NOTE) Fact Sheet for Patients: BloggerCourse.com  Fact Sheet for Healthcare Providers: SeriousBroker.it  This test is not yet approved or cleared by the [place] FDA and has been authorized for detection and/or diagnosis of SARS-CoV-2 by FDA under an Emergency Use Authorization (EUA). This EUA will remain in effect (meaning this test can be used) for the duration of the COVID-19 declaration under Section 564(b)(1) of the Act, 21 U.S.C. section 360bbb-3(b)(1), unless the authorization is terminated or revoked.  Performed at [facility], [address], [place], [place] [address]       Radiology Studies: DG Chest Port 1 View  Result Date: 05/26/2023 CLINICAL DATA:  Fall and altered mental status. EXAM: PORTABLE CHEST 1 VIEW COMPARISON:  07/10/2017 FINDINGS: Again noted is significant dextroscoliosis in lower thoracic spine. Heart size is within normal limits and stable. Atherosclerotic calcifications at the aortic arch. Evidence for biapical lung scarring. Otherwise, the lungs  are clear without focal airspace disease or overt pulmonary edema. Negative for a pneumothorax. IMPRESSION: 1. No acute cardiopulmonary disease. 2. Scoliosis. Electronically Signed   By: Richarda Overlie M.D.   On: 05/26/2023 04:22   CT Cervical Spine Wo Contrast  Result Date: 05/25/2023 CLINICAL DATA:  Neck trauma from fall EXAM: CT CERVICAL SPINE WITHOUT CONTRAST TECHNIQUE: Multidetector CT imaging of the cervical spine was performed without intravenous contrast. Multiplanar CT image reconstructions were also generated. RADIATION DOSE REDUCTION: This exam was performed according to the departmental dose-optimization program which includes automated exposure control, adjustment of the mA and/or kV according to patient size and/or use of iterative reconstruction technique. COMPARISON:  None Available. FINDINGS: Alignment: No evidence of traumatic malalignment. Skull base and vertebrae: Demineralization. No acute fracture. No primary bone lesion or focal pathologic process. Soft tissues and spinal canal: No prevertebral fluid or swelling. No visible canal hematoma. Disc levels: Age commensurate degenerative spondylosis and facet arthropathy. No severe spinal canal narrowing. Upper chest: No acute abnormality. Other: Carotid calcification. IMPRESSION: No acute fracture in the cervical spine. Electronically Signed   By: Minerva Fester M.D.   On: 05/25/2023 23:19   CT Head Wo Contrast  Result Date: 05/25/2023 CLINICAL DATA:  Fall, trauma. EXAM: CT HEAD WITHOUT CONTRAST TECHNIQUE: Contiguous axial images were obtained from the base of the skull through the vertex without intravenous contrast. RADIATION DOSE REDUCTION: This exam was performed according to the departmental dose-optimization program which includes automated exposure control, adjustment of the mA and/or kV according to patient size and/or use of iterative reconstruction technique. COMPARISON:  None Available. FINDINGS: Brain: No evidence of acute  infarction, hemorrhage, hydrocephalus, extra-axial collection or mass lesion/mass effect. There is moderate diffuse atrophy and moderate periventricular white matter hypodensity, likely chronic small vessel ischemic change. Vascular: Atherosclerotic calcifications are present within the cavernous internal carotid arteries. Skull: Normal. Negative for fracture or focal lesion. Sinuses/Orbits: No acute finding. Other: None. IMPRESSION: 1. No acute intracranial abnormality. 2. Moderate diffuse atrophy and moderate chronic small vessel ischemic change. Electronically Signed   By: Darliss Cheney M.D.   On: 05/25/2023 23:16       LOS: 0 days   Osvaldo Shipper  [facility] Pager on [email or web address]  05/26/2023, 10:18 AM

## 2023-05-27 DIAGNOSIS — I639 Cerebral infarction, unspecified: Secondary | ICD-10-CM | POA: Diagnosis not present

## 2023-05-27 LAB — BASIC METABOLIC PANEL
Anion gap: 12 (ref 5–15)
BUN: 9 mg/dL (ref 8–23)
CO2: 17 mmol/L — ABNORMAL LOW (ref 22–32)
Calcium: 9 mg/dL (ref 8.9–10.3)
Chloride: 108 mmol/L (ref 98–111)
Creatinine, Ser: 0.94 mg/dL (ref 0.44–1.00)
GFR, Estimated: 55 mL/min — ABNORMAL LOW (ref >60)
Glucose, Bld: 74 mg/dL (ref 70–99)
Potassium: 4.9 mmol/L (ref 3.5–5.1)
Sodium: 137 mmol/L (ref 135–145)

## 2023-05-27 LAB — CBC
HCT: 46.3 % — ABNORMAL HIGH (ref 36.0–46.0)
Hemoglobin: 14.6 g/dL (ref 12.0–15.0)
MCH: 29.8 pg (ref 26.0–34.0)
MCHC: 31.5 g/dL (ref 30.0–36.0)
MCV: 94.5 fL (ref 80.0–100.0)
Platelets: 232 10*3/uL (ref 150–400)
RBC: 4.9 MIL/uL (ref 3.87–5.11)
RDW: 12.1 % (ref 11.5–15.5)
WBC: 10.7 10*3/uL — ABNORMAL HIGH (ref 4.0–10.5)
nRBC: 0 % (ref 0.0–0.2)

## 2023-05-27 LAB — MAGNESIUM: Magnesium: 2 mg/dL (ref 1.7–2.4)

## 2023-05-27 LAB — URINE CULTURE

## 2023-05-27 MED ORDER — SODIUM CHLORIDE 0.9 % IV SOLN: 1.0000 g | INTRAVENOUS | Status: DC

## 2023-05-27 NOTE — Progress Notes (Signed)
OT Cancellation Note  Patient Details Name: Anna Mullins MRN: [id number] DOB: Jan 05, 1924   Cancelled Treatment:    Reason Eval/Treat Not Completed: (P) Patient declined, no reason specified, Pt refused all activities after repeated requests. Per chart Pt has HHOT set up, all DME and support needed at home to remain safe, will re-attempt as time allows.  [name] 05/27/2023, 10:28 AM

## 2023-05-27 NOTE — Progress Notes (Signed)
Patient was on O2 at the start of the shift. No signs and complains of SOB noted. Weaned off . O2 sat stayed at 95% on room air.

## 2023-05-27 NOTE — Discharge Summary (Signed)
[facility]  Physician Discharge Summary   Patient ID: Anna Mullins MRN: [id number] DOB/AGE: 01/19/1924 89 y.o.  Admit date: 05/25/2023 Discharge date:   05/27/2023   PCP: Noberto Retort, MD  DISCHARGE DIAGNOSES:  Acute ischemic stroke Acute metabolic encephalopathy, improved History of dementia   RECOMMENDATIONS FOR OUTPATIENT FOLLOW UP: Home health PT OT and palliative care has been ordered    Home Health: PT Equipment/Devices: None  CODE STATUS: DNR  DISCHARGE CONDITION: fair  Diet recommendation: As before  INITIAL HISTORY: 89 y.o. female with hx of dementia, hypertension, diabetes, who was brought in by EMS after found down at home.  Patient lives in a connected home to her daughter and son-in-law.  At baseline she is disoriented, although able to have limited conversation, she is semidependent on ADLs.  Able to feed herself, transfer.  But has help with things like toileting.  She does not use any DME.  Over the past few weeks has been having decreased oral intake.  And in the past few days much more somnolent and sleeping most of the time.  Then today daughter found her on the ground in the laundry room.  Unknown if any syncope.  She was hospitalized for further management.   HOSPITAL COURSE:   Acute ischemic stroke Patient did not have any focal neurological deficits to begin with.  EEG was done due to her altered mental status which raised concern for cortical dysfunction in the right hemisphere.  This prompted MRI of the brain which showed acute infarcts. Patient reevaluated this morning and again does not exhibit any focal neurological deficits. Findings were discussed with patient's daughter.  Considering patient's advanced age and progressive dementia daughter does not want additional testing.  This is reasonable.  Plan is to pursue more of a palliative/comfort care approach.  Patient's daughter wants to take her home.  Will request palliative care  follow-up.   Because of the comfort and palliative strategy we will not initiate any further workup for a stroke.  No antiplatelet agents for now as patient has refused to take medications previously.  Ground-level fall Likely due to stroke.   No obvious injuries identified on examination.   Seen by PT and OT.  Home health ordered.   Acute encephalopathy Likely brought on by stroke.  See above.   Seems to be close to baseline now.  UA does not suggest infection.  No need to continue antibiotics at discharge.     History of dementia Discussed with patient's daughter.  She was diagnosed with dementia few years ago.  She has been declining in terms of her oral intake as well as functional status over the past year or so.  She is lost weight.  Daughter understands that this is a progressive condition.  Does not want aggressive testing.  Patient is already DNR.  Patient's daughter however does not want to take her home as long as she is able to go to the bathroom and back.  She is able to do this.  Home health has been ordered.   Accelerated hypertension Not on any antihypertensive medications since she has refused to take them previously.   Diabetes mellitus type 2 Not on any medications for same.    Patient is stable.  Okay for discharge home with daughter today.  PERTINENT LABS:  The results of significant diagnostics from this hospitalization (including imaging, microbiology, ancillary and laboratory) are listed below for reference.    Microbiology: Recent Results (from the past 240  hour(s))  Urine Culture     Status: None   Collection Time: 05/26/23 12:59 AM   Specimen: Urine, Random  Result Value Ref Range Status   Specimen Description URINE, RANDOM  Final   Special Requests NONE  Final   Culture   Final    NO GROWTH Performed at [facility], [address], [place], [place] [address]    Report Status 05/27/2023 FINAL  Final  Culture, blood (routine x 2)     Status:  None (Preliminary result)   Collection Time: 05/26/23 12:59 AM   Specimen: BLOOD LEFT HAND  Result Value Ref Range Status   Specimen Description BLOOD LEFT HAND  Final   Special Requests   Final    BOTTLES DRAWN AEROBIC AND ANAEROBIC Blood Culture results may not be optimal due to an inadequate volume of blood received in culture bottles   Culture   Final    NO GROWTH 1 DAY Performed at [facility], [address], [place], [place] [address]    Report Status PENDING  Incomplete  Culture, blood (routine x 2)     Status: None (Preliminary result)   Collection Time: 05/26/23  1:09 AM   Specimen: BLOOD LEFT HAND  Result Value Ref Range Status   Specimen Description BLOOD LEFT HAND  Final   Special Requests   Final    BOTTLES DRAWN AEROBIC AND ANAEROBIC Blood Culture adequate volume   Culture   Final    NO GROWTH 1 DAY Performed at [facility], [address] [place], [place] [address]    Report Status PENDING  Incomplete  Resp panel by RT-PCR (RSV, Flu A&B, Covid) Anterior Nasal Swab     Status: None   Collection Time: 05/26/23  1:47 AM   Specimen: Anterior Nasal Swab  Result Value Ref Range Status   SARS Coronavirus 2 by RT PCR NEGATIVE NEGATIVE Final   Influenza A by PCR NEGATIVE NEGATIVE Final   Influenza B by PCR NEGATIVE NEGATIVE Final    Comment: (NOTE) The Xpert Xpress SARS-CoV-2/FLU/RSV plus assay is intended as an aid in the diagnosis of influenza from Nasopharyngeal swab specimens and should not be used as a sole basis for treatment. Nasal washings and aspirates are unacceptable for Xpert Xpress SARS-CoV-2/FLU/RSV testing.  Fact Sheet for Patients: BloggerCourse.com  Fact Sheet for Healthcare Providers: SeriousBroker.it  This test is not yet approved or cleared by the [place] FDA and has been authorized for detection and/or diagnosis of SARS-CoV-2 by FDA under an Emergency Use Authorization  (EUA). This EUA will remain in effect (meaning this test can be used) for the duration of the COVID-19 declaration under Section 564(b)(1) of the Act, 21 U.S.C. section 360bbb-3(b)(1), unless the authorization is terminated or revoked.     Resp Syncytial Virus by PCR NEGATIVE NEGATIVE Final    Comment: (NOTE) Fact Sheet for Patients: BloggerCourse.com  Fact Sheet for Healthcare Providers: SeriousBroker.it  This test is not yet approved or cleared by the [place] FDA and has been authorized for detection and/or diagnosis of SARS-CoV-2 by FDA under an Emergency Use Authorization (EUA). This EUA will remain in effect (meaning this test can be used) for the duration of the COVID-19 declaration under Section 564(b)(1) of the Act, 21 U.S.C. section 360bbb-3(b)(1), unless the authorization is terminated or revoked.  Performed at [facility], [address], [place], [place] [address]      Labs:   Basic Metabolic Panel:  Recent Labs  Lab 05/25/23 2129 05/25/23 2157 05/26/23 0011 05/27/23 0231  NA 137 140 141 137  K 3.9 4.0 4.3 4.9  CL 109 107  --  108  CO2 16*  --   --  17*  GLUCOSE 158* 154*  --  74  BUN 15 17  --  9  CREATININE 1.07* 1.00  --  0.94  CALCIUM 8.8*  --   --  9.0  MG  --   --   --  2.0   Liver Function Tests: Recent Labs  Lab 05/25/23 2129  AST 19  ALT 9  ALKPHOS 83  BILITOT 1.0  PROT 6.1*  ALBUMIN 3.0*    Recent Labs  Lab 05/26/23 0001  AMMONIA <10   CBC: Recent Labs  Lab 05/25/23 2129 05/25/23 2157 05/26/23 0011 05/27/23 0501  WBC 8.6  --   --  10.7*  NEUTROABS 6.6  --   --   --   HGB 13.8 13.9 13.9 14.6  HCT 43.7 41.0 41.0 46.3*  MCV 95.2  --   --  94.5  PLT 233  --   --  232     CBG: Recent Labs  Lab 05/25/23 2124  GLUCAP 155*     IMAGING STUDIES MR BRAIN W WO CONTRAST  Result Date: 05/26/2023 CLINICAL DATA:  Altered mental status EXAM: MRI HEAD WITHOUT  AND WITH CONTRAST TECHNIQUE: Multiplanar, multiecho pulse sequences of the brain and surrounding structures were obtained without and with intravenous contrast. CONTRAST:  5mL GADAVIST GADOBUTROL 1 MMOL/ML IV SOLN COMPARISON:  None Available. FINDINGS: Brain: Symmetric abnormal diffusion restriction within the thalami. Small area of abnormal diffusion restriction within the right corona radiata. Chronic microhemorrhage in the right cerebellum. There is multifocal hyperintense T2-weighted signal within the white matter. Generalized volume loss. The midline structures are normal. Vascular: Abnormal appearance of the proximal left vertebral artery flow void. The other skull base flow voids are maintained. Skull and upper cervical spine: Negative Sinuses/Orbits: Bilateral ocular lens replacements. Trace right mastoid fluid. Other: None IMPRESSION: 1. 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 2. Small area of acute ischemia within the right corona radiata. 3. Abnormal appearance of the proximal left vertebral artery flow void, which may indicate stenosis or slow flow. Electronically Signed   By: Deatra Robinson M.D.   On: 05/26/2023 21:08   EEG adult  Result Date: 05/26/2023 Anna Quest, MD     05/26/2023 12:42 PM Patient Name: Anna Mullins MRN: [id number] Epilepsy Attending: Charlsie Mullins Referring Physician/Provider: Osvaldo Shipper, MD Date: 05/26/2023 Duration: 23.21 mins Patient history: 89yo F with ams getting eeg to evaluate for seizure Level of alertness: Awake AEDs during EEG study: None Technical aspects: This EEG study was done with scalp electrodes positioned according to the 10-20 International system of electrode placement. Electrical activity was reviewed with band pass filter of 1-70Hz , sensitivity of 7 uV/mm, display speed of 57mm/sec with a 60Hz  notched filter applied as appropriate. EEG data were recorded continuously and digitally stored.   Video monitoring was available and reviewed as appropriate. Description: The posterior dominant rhythm consists of 7 Hz activity of moderate voltage (25-35 uV) seen predominantly in posterior head regions, symmetric and reactive to eye opening and eye closing. EEG showed continuous generalized and lateralized right hemisphere 3 to 6 Hz theta-delta slowing. Hyperventilation and photic stimulation were not performed.   ABNORMALITY - Continuous slow, generalized and lateralized right hemisphere IMPRESSION: This study  is suggestive of cortical dysfunction arising from right hemisphere likely secondary to underlying structural abnormality. Additionally there is moderate diffuse encephalopathy. No seizures or epileptiform discharges were seen throughout the recording. Anna Mullins   DG Chest Port 1 View  Result Date: 05/26/2023 CLINICAL DATA:  Fall and altered mental status. EXAM: PORTABLE CHEST 1 VIEW COMPARISON:  07/10/2017 FINDINGS: Again noted is significant dextroscoliosis in lower thoracic spine. Heart size is within normal limits and stable. Atherosclerotic calcifications at the aortic arch. Evidence for biapical lung scarring. Otherwise, the lungs are clear without focal airspace disease or overt pulmonary edema. Negative for a pneumothorax. IMPRESSION: 1. No acute cardiopulmonary disease. 2. Scoliosis. Electronically Signed   By: Richarda Overlie M.D.   On: 05/26/2023 04:22   CT Cervical Spine Wo Contrast  Result Date: 05/25/2023 CLINICAL DATA:  Neck trauma from fall EXAM: CT CERVICAL SPINE WITHOUT CONTRAST TECHNIQUE: Multidetector CT imaging of the cervical spine was performed without intravenous contrast. Multiplanar CT image reconstructions were also generated. RADIATION DOSE REDUCTION: This exam was performed according to the departmental dose-optimization program which includes automated exposure control, adjustment of the mA and/or kV according to patient size and/or use of iterative reconstruction  technique. COMPARISON:  None Available. FINDINGS: Alignment: No evidence of traumatic malalignment. Skull base and vertebrae: Demineralization. No acute fracture. No primary bone lesion or focal pathologic process. Soft tissues and spinal canal: No prevertebral fluid or swelling. No visible canal hematoma. Disc levels: Age commensurate degenerative spondylosis and facet arthropathy. No severe spinal canal narrowing. Upper chest: No acute abnormality. Other: Carotid calcification. IMPRESSION: No acute fracture in the cervical spine. Electronically Signed   By: Minerva Fester M.D.   On: 05/25/2023 23:19   CT Head Wo Contrast  Result Date: 05/25/2023 CLINICAL DATA:  Fall, trauma. EXAM: CT HEAD WITHOUT CONTRAST TECHNIQUE: Contiguous axial images were obtained from the base of the skull through the vertex without intravenous contrast. RADIATION DOSE REDUCTION: This exam was performed according to the departmental dose-optimization program which includes automated exposure control, adjustment of the mA and/or kV according to patient size and/or use of iterative reconstruction technique. COMPARISON:  None Available. FINDINGS: Brain: No evidence of acute infarction, hemorrhage, hydrocephalus, extra-axial collection or mass lesion/mass effect. There is moderate diffuse atrophy and moderate periventricular white matter hypodensity, likely chronic small vessel ischemic change. Vascular: Atherosclerotic calcifications are present within the cavernous internal carotid arteries. Skull: Normal. Negative for fracture or focal lesion. Sinuses/Orbits: No acute finding. Other: None. IMPRESSION: 1. No acute intracranial abnormality. 2. Moderate diffuse atrophy and moderate chronic small vessel ischemic change. Electronically Signed   By: Darliss Cheney M.D.   On: 05/25/2023 23:16    DISCHARGE EXAMINATION: Vitals:   05/27/23 0457 05/27/23 0506 05/27/23 0740 05/27/23 0851  BP:  (!) 160/60 (!) 175/71   Pulse:  (!) 57 (!) 57    Resp:  17 17   Temp:  98.3 F (36.8 C) 98.3 F (36.8 C)   TempSrc:  Oral Oral   SpO2:  100% 100% 95%  Weight: 53.8 kg      General appearance: Awake alert.  In no distress Resp: Clear to auscultation bilaterally.  Normal effort Cardio: S1-S2 is normal regular.  No S3-S4.  No rubs murmurs or bruit GI: Abdomen is soft.  Nontender nondistended.  Bowel sounds are present normal.  No masses organomegaly Moving all of her extremities.  No obvious focal neurological deficits noted.  She is disoriented as is her baseline.   DISPOSITION: Home  with daughter  Discharge Instructions     Call MD for:  difficulty breathing, headache or visual disturbances   Complete by: As directed    Call MD for:  extreme fatigue   Complete by: As directed    Call MD for:  persistant dizziness or light-headedness   Complete by: As directed    Call MD for:  persistant nausea and vomiting   Complete by: As directed    Call MD for:  severe uncontrolled pain   Complete by: As directed    Call MD for:  temperature >100.4   Complete by: As directed    Diet general   Complete by: As directed    Discharge instructions   Complete by: As directed    You were cared for by a hospitalist during your hospital stay. If you have any questions about your discharge medications or the care you received while you were in the hospital after you are discharged, you can call the unit and asked to speak with the hospitalist on call if the hospitalist that took care of you is not available. Once you are discharged, your primary care physician will handle any further medical issues. Please note that NO REFILLS for any discharge medications will be authorized once you are discharged, as it is imperative that you return to your primary care physician (or establish a relationship with a primary care physician if you do not have one) for your aftercare needs so that they can reassess your need for medications and monitor your lab values.  If you do not have a primary care physician, you can call [phone] for a physician referral.   Increase activity slowly   Complete by: As directed           Allergies as of 05/27/2023   No Known Allergies      Medication List    You have not been prescribed any medications.       Follow-up Information     [facility] up.   Specialty: [facility] Contact information: [address] [place] [address] [place] [address] [phone]                 TOTAL DISCHARGE TIME: 35 mins  Lynae Pederson Rito Ehrlich  [facility] Pager on [email or web address]  05/27/2023, 10:19 AM

## 2023-05-27 NOTE — TOC Transition Note (Signed)
Transition of Care [facility]) - CM/SW Discharge Note   Patient Details  Name: Anna Mullins MRN: [id number] Date of Birth: [birth year]  [facility] CM/SW Contact:  Ronny Bacon, RN Phone Number: 05/27/2023, 8:49 AM   Clinical Narrative:   Patient is being discharged today. [facility] made aware. Provider adding palliative care orders, arranged through on call [facility] coverage. Floor nurse reports that patient has not been getting up to ambulate in what appears to be due to confusion. Floor nurse did a trial on patient without oxygen, O2 sats 95% on room air. Patient does not qualify for oxygen at this time.    Final next level of care: Home w Home Health Services (+ Palliative care) Barriers to Discharge: No Barriers Identified   Patient Goals and CMS Choice CMS Medicare.gov Compare Post Acute Care list provided to:: Patient Represenative (must comment) (daughter) Choice offered to / list presented to : Adult Children  Discharge Placement                         Discharge Plan and Services Additional resources added to the After Visit Summary for     Discharge Planning Services: CM Consult Post Acute Care Choice: Home Health          DME Arranged: N/A         HH Arranged: PT, OT, Nurse's Aide HH Agency: [facility] Date [facility] Agency Contacted: 05/26/23 Time HH Agency Contacted: [phone] Representative spoke with at [facility] Agency: Tresa Endo  Social Determinants of Health (SDOH) Interventions SDOH Screenings   Tobacco Use: Low Risk  (09/10/2020)     Readmission Risk Interventions     No data to display

## 2023-05-30 DIAGNOSIS — R2689 Other abnormalities of gait and mobility: Secondary | ICD-10-CM | POA: Diagnosis not present

## 2023-05-30 DIAGNOSIS — R627 Adult failure to thrive: Secondary | ICD-10-CM | POA: Diagnosis not present

## 2023-05-30 DIAGNOSIS — I69398 Other sequelae of cerebral infarction: Secondary | ICD-10-CM | POA: Diagnosis not present

## 2023-05-30 DIAGNOSIS — E119 Type 2 diabetes mellitus without complications: Secondary | ICD-10-CM | POA: Diagnosis not present

## 2023-05-30 DIAGNOSIS — Z9181 History of falling: Secondary | ICD-10-CM | POA: Diagnosis not present

## 2023-05-30 DIAGNOSIS — Z556 Problems related to health literacy: Secondary | ICD-10-CM | POA: Diagnosis not present

## 2023-05-30 DIAGNOSIS — G9341 Metabolic encephalopathy: Secondary | ICD-10-CM | POA: Diagnosis not present

## 2023-05-30 DIAGNOSIS — I10 Essential (primary) hypertension: Secondary | ICD-10-CM | POA: Diagnosis not present

## 2023-05-31 LAB — CULTURE, BLOOD (ROUTINE X 2)
Culture: NO GROWTH
Culture: NO GROWTH
Special Requests: ADEQUATE

## 2023-07-30 DEATH — deceased
